# Patient Record
Sex: Male | Born: 1965 | Race: White | Hispanic: No | Marital: Single | Smoking: Former smoker
Health system: Southern US, Community
[De-identification: ages and names within clinical notes are randomized; demographics above are authoritative.]

## PROBLEM LIST (undated history)

## (undated) DIAGNOSIS — K4090 Unilateral inguinal hernia, without obstruction or gangrene, not specified as recurrent: Secondary | ICD-10-CM

## (undated) DIAGNOSIS — F411 Generalized anxiety disorder: Secondary | ICD-10-CM

## (undated) HISTORY — DX: Generalized anxiety disorder: F41.1

---

## 1999-09-11 ENCOUNTER — Encounter: Payer: Self-pay | Admitting: Emergency Medicine

## 1999-09-11 ENCOUNTER — Emergency Department (HOSPITAL_COMMUNITY): Admission: EM | Admit: 1999-09-11 | Discharge: 1999-09-11 | Payer: Self-pay | Admitting: Emergency Medicine

## 2001-01-23 ENCOUNTER — Emergency Department (HOSPITAL_COMMUNITY): Admission: EM | Admit: 2001-01-23 | Discharge: 2001-01-23 | Payer: Self-pay | Admitting: Emergency Medicine

## 2001-01-23 ENCOUNTER — Encounter: Payer: Self-pay | Admitting: Emergency Medicine

## 2002-01-31 ENCOUNTER — Encounter: Payer: Self-pay | Admitting: *Deleted

## 2002-01-31 ENCOUNTER — Emergency Department (HOSPITAL_COMMUNITY): Admission: EM | Admit: 2002-01-31 | Discharge: 2002-01-31 | Payer: Self-pay | Admitting: *Deleted

## 2012-06-10 ENCOUNTER — Encounter (HOSPITAL_COMMUNITY): Payer: Self-pay | Admitting: Emergency Medicine

## 2012-06-10 ENCOUNTER — Emergency Department (HOSPITAL_COMMUNITY)
Admission: EM | Admit: 2012-06-10 | Discharge: 2012-06-10 | Disposition: A | Discharge status: Home / Self Care | Payer: Self-pay | Attending: Emergency Medicine | Admitting: Emergency Medicine

## 2012-06-10 ENCOUNTER — Emergency Department (HOSPITAL_COMMUNITY): Payer: Self-pay

## 2012-06-10 DIAGNOSIS — N498 Inflammatory disorders of other specified male genital organs: Secondary | ICD-10-CM | POA: Insufficient documentation

## 2012-06-10 DIAGNOSIS — K4090 Unilateral inguinal hernia, without obstruction or gangrene, not specified as recurrent: Secondary | ICD-10-CM | POA: Insufficient documentation

## 2012-06-10 DIAGNOSIS — K5900 Constipation, unspecified: Secondary | ICD-10-CM | POA: Insufficient documentation

## 2012-06-10 DIAGNOSIS — R109 Unspecified abdominal pain: Secondary | ICD-10-CM | POA: Insufficient documentation

## 2012-06-10 HISTORY — DX: Unilateral inguinal hernia, without obstruction or gangrene, not specified as recurrent: K40.90

## 2012-06-10 LAB — CBC WITH DIFFERENTIAL/PLATELET
Basophils Absolute: 0 10*3/uL (ref 0.0–0.1)
Basophils Relative: 0 % (ref 0–1)
Eosinophils Absolute: 0.2 10*3/uL (ref 0.0–0.7)
Eosinophils Relative: 3 % (ref 0–5)
HCT: 44.4 % (ref 39.0–52.0)
Hemoglobin: 16.5 g/dL (ref 13.0–17.0)
Lymphocytes Relative: 20 % (ref 12–46)
Lymphs Abs: 1.2 10*3/uL (ref 0.7–4.0)
MCH: 31.2 pg (ref 26.0–34.0)
MCHC: 37.2 g/dL — ABNORMAL HIGH (ref 30.0–36.0)
MCV: 83.9 fL (ref 78.0–100.0)
Monocytes Absolute: 0.6 10*3/uL (ref 0.1–1.0)
Monocytes Relative: 10 % (ref 3–12)
Neutro Abs: 3.9 10*3/uL (ref 1.7–7.7)
Neutrophils Relative %: 67 % (ref 43–77)
Platelets: 128 10*3/uL — ABNORMAL LOW (ref 150–400)
RBC: 5.29 MIL/uL (ref 4.22–5.81)
RDW: 12.6 % (ref 11.5–15.5)
WBC: 5.8 10*3/uL (ref 4.0–10.5)

## 2012-06-10 LAB — COMPREHENSIVE METABOLIC PANEL
ALT: 28 U/L (ref 0–53)
AST: 23 U/L (ref 0–37)
Albumin: 4.4 g/dL (ref 3.5–5.2)
Alkaline Phosphatase: 52 U/L (ref 39–117)
BUN: 10 mg/dL (ref 6–23)
CO2: 30 mEq/L (ref 19–32)
Calcium: 10.2 mg/dL (ref 8.4–10.5)
Chloride: 98 mEq/L (ref 96–112)
Creatinine, Ser: 0.93 mg/dL (ref 0.50–1.35)
GFR calc Af Amer: >90 mL/min (ref >90)
GFR calc non Af Amer: >90 mL/min (ref >90)
Glucose, Bld: 100 mg/dL — ABNORMAL HIGH (ref 70–99)
Potassium: 4 mEq/L (ref 3.5–5.1)
Sodium: 137 mEq/L (ref 135–145)
Total Bilirubin: 1.1 mg/dL (ref 0.3–1.2)
Total Protein: 7.9 g/dL (ref 6.0–8.3)

## 2012-06-10 NOTE — ED Provider Notes (Signed)
Medical screening examination/treatment/procedure(s) were conducted as a shared visit with non-physician practitioner(s) and myself.  I personally evaluated the patient during the encounter.  Reducible left inguinal hernia. No evidence of incarceration. Discussed with general surgery. Will get followup as outpatient.  Donnetta Hutching, MD 06/10/12 1346

## 2012-06-10 NOTE — ED Notes (Signed)
Pt states has had hernia about one year. Pt states hurting really bad today. States while in prison was going to have surgery but was released and told to have it fixed after he got out. Nausea but denies vomiting

## 2012-06-10 NOTE — ED Notes (Signed)
Patient with no complaints at this time. Respirations even and unlabored. Skin warm/dry. Discharge instructions reviewed with patient at this time. Patient given opportunity to voice concerns/ask questions. IV removed per policy and band-aid applied to site. Patient discharged at this time and left Emergency Department with steady gait.  

## 2012-06-10 NOTE — ED Provider Notes (Signed)
History     CSN: [id number]  Arrival date & time 06/10/12  9147   First MD Initiated Contact with Patient 06/10/12 [phone]      Chief Complaint  Patient presents with  . Inguinal Hernia    (Consider location/radiation/quality/duration/timing/severity/associated sxs/prior treatment) HPI Comments: Pt recently released from prison.  Dx with L inguinal hernia ~ 9 months ago.  States he is trying to go the free clinic to get it repaired.  He also spoke with betty ratliffe about getting assistance through [employer] but has not filled out the paper work.  No fever or chills.  Recently having pain when urinating or having BM.  States the bulge goes away if he lies supine and presses on it, but it reoccurs whenever he stands up.  The history is provided by the patient. No language interpreter was used.    Past Medical History  Diagnosis Date  . Inguinal hernia     History reviewed. No pertinent past surgical history.  History reviewed. No pertinent family history.  History  Substance Use Topics  . Smoking status: Never Smoker   . Smokeless tobacco: Not on file  . Alcohol Use: No      Review of Systems  Constitutional: Negative for fever and chills.  Gastrointestinal: Positive for abdominal pain and constipation. Negative for nausea, vomiting, diarrhea and blood in stool.  Genitourinary: Positive for scrotal swelling. Negative for dysuria, urgency, frequency, hematuria, penile swelling, penile pain and testicular pain.  All other systems reviewed and are negative.    Allergies  Review of patient's allergies indicates no known allergies.  Home Medications   Current Outpatient Rx  Name  Route  Sig  Dispense  Refill  . ADULT MULTIVITAMIN W/MINERALS CH   Oral   Take 1 tablet by mouth daily.           BP 121/79  Pulse 75  Temp 97.8 F (36.6 C) (Oral)  Resp 20  SpO2 97%  Physical Exam  Nursing note and vitals reviewed. Constitutional: He is oriented to person,  place, and time. He appears well-developed and well-nourished.  HENT:  Head: Normocephalic and atraumatic.  Eyes: EOM are normal.  Neck: Normal range of motion.  Cardiovascular: Normal rate, regular rhythm, normal heart sounds and intact distal pulses.   Pulmonary/Chest: Effort normal and breath sounds normal. No respiratory distress.  Abdominal: Soft. He exhibits no distension and no mass. There is tenderness. There is no rebound and no guarding.  Genitourinary:    Circumcised.  Musculoskeletal: Normal range of motion.  Neurological: He is alert and oriented to person, place, and time.  Skin: Skin is warm and dry.  Psychiatric: He has a normal mood and affect. Judgment normal.    ED Course  Procedures (including critical care time)  Labs Reviewed  CBC WITH DIFFERENTIAL - Abnormal; Notable for the following:    MCHC 37.2 (*)     Platelets 128 (*)     All other components within normal limits  COMPREHENSIVE METABOLIC PANEL - Abnormal; Notable for the following:    Glucose, Bld 100 (*)     All other components within normal limits   Dg Abd Acute W/chest  06/10/2012  *RADIOLOGY REPORT*  Clinical Data: Known hernia, difficulty with bowel movements  ACUTE ABDOMEN SERIES (ABDOMEN 2 VIEW & CHEST 1 VIEW)  Comparison: None.  Findings: The heart and pulmonary vascularity are within normal limits.  The lungs are clear bilaterally.  The abdomen demonstrates a nonobstructive bowel gas  pattern.  No abnormal mass or abnormal calcifications are seen.  No free air is noted.  No acute bony abnormality is seen.  IMPRESSION: Nonspecific chest and abdomen.   Original Report Authenticated By: Alcide Clever, M.D.      1. Left inguinal hernia       MDM  Spoke with dr. Leticia Penna.  He will see pt in his office.        Evalina Field, [place] 06/10/12 1141

## 2013-02-14 ENCOUNTER — Emergency Department (HOSPITAL_COMMUNITY): Payer: Self-pay | Admitting: Anesthesiology

## 2013-02-14 ENCOUNTER — Encounter (HOSPITAL_COMMUNITY)
Admission: EM | Disposition: A | Discharge status: Home / Self Care | Payer: Self-pay | Source: Home / Self Care | Attending: Emergency Medicine

## 2013-02-14 ENCOUNTER — Encounter (HOSPITAL_COMMUNITY): Payer: Self-pay | Admitting: Anesthesiology

## 2013-02-14 ENCOUNTER — Emergency Department (HOSPITAL_COMMUNITY)
Admission: EM | Admit: 2013-02-14 | Discharge: 2013-02-14 | Disposition: A | Discharge status: Home / Self Care | Payer: Self-pay | Attending: General Surgery | Admitting: General Surgery

## 2013-02-14 ENCOUNTER — Encounter (HOSPITAL_COMMUNITY): Payer: Self-pay | Admitting: *Deleted

## 2013-02-14 DIAGNOSIS — K4030 Unilateral inguinal hernia, with obstruction, without gangrene, not specified as recurrent: Secondary | ICD-10-CM | POA: Insufficient documentation

## 2013-02-14 HISTORY — PX: INSERTION OF MESH: SHX5868

## 2013-02-14 HISTORY — PX: OMENTECTOMY: SHX5985

## 2013-02-14 HISTORY — PX: INGUINAL HERNIA REPAIR: SHX194

## 2013-02-14 LAB — COMPREHENSIVE METABOLIC PANEL
AST: 18 U/L (ref 0–37)
Albumin: 4.1 g/dL (ref 3.5–5.2)
Alkaline Phosphatase: 52 U/L (ref 39–117)
BUN: 9 mg/dL (ref 6–23)
CO2: 29 mEq/L (ref 19–32)
Chloride: 98 mEq/L (ref 96–112)
Potassium: 3.4 mEq/L — ABNORMAL LOW (ref 3.5–5.1)
Total Bilirubin: 1.5 mg/dL — ABNORMAL HIGH (ref 0.3–1.2)

## 2013-02-14 LAB — CBC WITH DIFFERENTIAL/PLATELET
Basophils Relative: 0 % (ref 0–1)
Hemoglobin: 16.1 g/dL (ref 13.0–17.0)
Lymphocytes Relative: 14 % (ref 12–46)
MCHC: 37.2 g/dL — ABNORMAL HIGH (ref 30.0–36.0)
Monocytes Relative: 10 % (ref 3–12)
Neutro Abs: 6 10*3/uL (ref 1.7–7.7)
Neutrophils Relative %: 75 % (ref 43–77)
RBC: 5.04 MIL/uL (ref 4.22–5.81)
WBC: 8 10*3/uL (ref 4.0–10.5)

## 2013-02-14 SURGERY — REPAIR, HERNIA, INGUINAL, INCARCERATED
Anesthesia: General | Wound class: Clean

## 2013-02-14 MED ORDER — HYDROCODONE-ACETAMINOPHEN 5-325 MG PO TABS: 1.0000 | ORAL_TABLET | ORAL | Status: DC | PRN

## 2013-02-14 MED ORDER — ROCURONIUM BROMIDE 100 MG/10ML IV SOLN
INTRAVENOUS | Status: DC | PRN
  Administered 2013-02-14: 10 mg via INTRAVENOUS
  Administered 2013-02-14: 25 mg via INTRAVENOUS
  Administered 2013-02-14: 5 mg via INTRAVENOUS
  Administered 2013-02-14: 10 mg via INTRAVENOUS

## 2013-02-14 MED ORDER — NEOSTIGMINE METHYLSULFATE 1 MG/ML IJ SOLN
INTRAMUSCULAR | Status: DC | PRN
  Administered 2013-02-14: 2 mg via INTRAVENOUS

## 2013-02-14 MED ORDER — FENTANYL CITRATE 0.05 MG/ML IJ SOLN
50.0000 ug | INTRAMUSCULAR | Status: AC | PRN
  Administered 2013-02-14 (×2): 50 ug via INTRAVENOUS
  Filled 2013-02-14 (×2): qty 2

## 2013-02-14 MED ORDER — ENOXAPARIN SODIUM 40 MG/0.4ML ~~LOC~~ SOLN
40.0000 mg | Freq: Once | SUBCUTANEOUS | Status: AC
  Administered 2013-02-14: 40 mg via SUBCUTANEOUS
  Filled 2013-02-14: qty 0.4

## 2013-02-14 MED ORDER — DEXTROSE 5 % IV SOLN
2.0000 g | INTRAVENOUS | Status: AC
  Administered 2013-02-14: 2 g via INTRAVENOUS

## 2013-02-14 MED ORDER — MIDAZOLAM HCL 2 MG/2ML IJ SOLN
1.0000 mg | INTRAMUSCULAR | Status: DC | PRN
  Administered 2013-02-14: 2 mg via INTRAVENOUS

## 2013-02-14 MED ORDER — BUPIVACAINE HCL (PF) 0.5 % IJ SOLN
INTRAMUSCULAR | Status: DC | PRN
  Administered 2013-02-14: 10 mL

## 2013-02-14 MED ORDER — DEXTROSE 5 % IV SOLN: 2.0000 g | INTRAVENOUS | Status: DC

## 2013-02-14 MED ORDER — LIDOCAINE HCL (CARDIAC) 10 MG/ML IV SOLN
INTRAVENOUS | Status: DC | PRN
  Administered 2013-02-14: 20 mg via INTRAVENOUS

## 2013-02-14 MED ORDER — GLYCOPYRROLATE 0.2 MG/ML IJ SOLN
INTRAMUSCULAR | Status: DC | PRN
  Administered 2013-02-14: 0.4 mg via INTRAVENOUS

## 2013-02-14 MED ORDER — FENTANYL CITRATE 0.05 MG/ML IJ SOLN
25.0000 ug | INTRAMUSCULAR | Status: DC | PRN
  Administered 2013-02-14 (×4): 50 ug via INTRAVENOUS

## 2013-02-14 MED ORDER — MORPHINE SULFATE 4 MG/ML IJ SOLN
4.0000 mg | INTRAMUSCULAR | Status: DC | PRN
  Administered 2013-02-14: 4 mg via INTRAVENOUS
  Filled 2013-02-14: qty 1

## 2013-02-14 MED ORDER — CELECOXIB 100 MG PO CAPS
400.0000 mg | ORAL_CAPSULE | Freq: Every day | ORAL | Status: AC
  Administered 2013-02-14: 400 mg via ORAL

## 2013-02-14 MED ORDER — LACTATED RINGERS IV SOLN
INTRAVENOUS | Status: DC
  Administered 2013-02-14 (×2): via INTRAVENOUS

## 2013-02-14 MED ORDER — SODIUM CHLORIDE 0.9 % IV SOLN
INTRAVENOUS | Status: DC
  Administered 2013-02-14: 14:00:00 via INTRAVENOUS

## 2013-02-14 MED ORDER — FENTANYL CITRATE 0.05 MG/ML IJ SOLN
INTRAMUSCULAR | Status: DC | PRN
  Administered 2013-02-14: 50 ug via INTRAVENOUS
  Administered 2013-02-14: 100 ug via INTRAVENOUS
  Administered 2013-02-14 (×2): 50 ug via INTRAVENOUS

## 2013-02-14 MED ORDER — ONDANSETRON HCL 4 MG/2ML IJ SOLN
4.0000 mg | Freq: Once | INTRAMUSCULAR | Status: AC | PRN
  Administered 2013-02-14: 4 mg via INTRAVENOUS

## 2013-02-14 MED ORDER — ONDANSETRON HCL 4 MG/2ML IJ SOLN
4.0000 mg | INTRAMUSCULAR | Status: AC | PRN
  Administered 2013-02-14 (×2): 4 mg via INTRAVENOUS
  Filled 2013-02-14 (×2): qty 2

## 2013-02-14 MED ORDER — 0.9 % SODIUM CHLORIDE (POUR BTL) OPTIME
TOPICAL | Status: DC | PRN
  Administered 2013-02-14: 1000 mL

## 2013-02-14 MED ORDER — PROPOFOL 10 MG/ML IV BOLUS
INTRAVENOUS | Status: DC | PRN
  Administered 2013-02-14: 30 mg via INTRAVENOUS
  Administered 2013-02-14: 20 mg via INTRAVENOUS
  Administered 2013-02-14: 150 mg via INTRAVENOUS

## 2013-02-14 MED ORDER — SUCCINYLCHOLINE CHLORIDE 20 MG/ML IJ SOLN
INTRAMUSCULAR | Status: DC | PRN
  Administered 2013-02-14: 140 mg via INTRAVENOUS

## 2013-02-14 SURGICAL SUPPLY — 40 items
BAG HAMPER (MISCELLANEOUS) ×3 IMPLANT
BENZOIN TINCTURE PRP APPL 2/3 (GAUZE/BANDAGES/DRESSINGS) ×3 IMPLANT
CLOTH BEACON ORANGE TIMEOUT ST (SAFETY) ×3 IMPLANT
COVER LIGHT HANDLE STERIS (MISCELLANEOUS) ×3 IMPLANT
DECANTER SPIKE VIAL GLASS SM (MISCELLANEOUS) ×3 IMPLANT
DRAIN PENROSE 18X.75 LTX STRL (MISCELLANEOUS) IMPLANT
ELECT REM PT RETURN 9FT ADLT (ELECTROSURGICAL) ×3
ELECTRODE REM PT RTRN 9FT ADLT (ELECTROSURGICAL) ×2 IMPLANT
FORMALIN 10 PREFIL 120ML (MISCELLANEOUS) IMPLANT
GLOVE BIO SURGEON STRL SZ 6.5 (GLOVE) ×3 IMPLANT
GLOVE BIOGEL PI IND STRL 7.0 (GLOVE) ×4 IMPLANT
GLOVE BIOGEL PI IND STRL 7.5 (GLOVE) ×4 IMPLANT
GLOVE BIOGEL PI INDICATOR 7.0 (GLOVE) ×2
GLOVE BIOGEL PI INDICATOR 7.5 (GLOVE) ×2
GLOVE ECLIPSE 7.0 STRL STRAW (GLOVE) ×3 IMPLANT
GLOVE OPTIFIT SS 6.5 STRL BRWN (GLOVE) ×3 IMPLANT
GOWN STRL REIN XL XLG (GOWN DISPOSABLE) ×9 IMPLANT
INST SET MAJOR GENERAL (KITS) ×3 IMPLANT
KIT ROOM TURNOVER APOR (KITS) ×3 IMPLANT
MANIFOLD NEPTUNE II (INSTRUMENTS) ×3 IMPLANT
MESH MARLEX PLUG MEDIUM (Mesh General) ×3 IMPLANT
NEEDLE HYPO 25X1 1.5 SAFETY (NEEDLE) ×3 IMPLANT
NS IRRIG 1000ML POUR BTL (IV SOLUTION) ×3 IMPLANT
PACK ABDOMINAL MAJOR (CUSTOM PROCEDURE TRAY) ×3 IMPLANT
PAD ARMBOARD 7.5X6 YLW CONV (MISCELLANEOUS) ×3 IMPLANT
SET BASIN LINEN APH (SET/KITS/TRAYS/PACK) ×3 IMPLANT
SOL PREP PROV IODINE SCRUB 4OZ (MISCELLANEOUS) ×3 IMPLANT
STRIP CLOSURE SKIN 1/2X4 (GAUZE/BANDAGES/DRESSINGS) ×3 IMPLANT
SUT ETHIBOND NAB MO 7 #0 18IN (SUTURE) ×3 IMPLANT
SUT MNCRL AB 4-0 PS2 18 (SUTURE) IMPLANT
SUT SILK 2 0 (SUTURE) ×1
SUT SILK 2-0 18XBRD TIE 12 (SUTURE) ×2 IMPLANT
SUT VIC AB 2-0 CT1 27 (SUTURE) ×1
SUT VIC AB 2-0 CT1 TAPERPNT 27 (SUTURE) ×2 IMPLANT
SUT VIC AB 3-0 SH 27 (SUTURE) ×2
SUT VIC AB 3-0 SH 27X BRD (SUTURE) ×4 IMPLANT
SUT VICRYL AB 3 0 TIES (SUTURE) IMPLANT
SYR BULB IRRIGATION 50ML (SYRINGE) ×3 IMPLANT
SYR CONTROL 10ML LL (SYRINGE) ×6 IMPLANT
TRAY FOLEY CATH 16FR SILVER (SET/KITS/TRAYS/PACK) ×3 IMPLANT

## 2013-02-14 NOTE — ED Provider Notes (Signed)
History    CSN: [id number] Arrival date & time 02/14/13  1244  First MD Initiated Contact with Patient 02/14/13 1337     Chief Complaint  Patient presents with  . Pelvic Pain    HPI Pt was seen at 1340.   Per pt, c/o gradual onset and persistence of constant left lower abd/pelvic "pain" since last evening.  Describes the pain as "my hernia hurts." States he has a known left inguinal hernia for the past 9 months that he can usually reduce by himself. States last night it "came out and I can't get in back in." States he went to sleep last night and woke up this morning with continued pain and inability to reduce the hernia. Last PO intake was last night. Denies vomiting/diarrhea, no fevers, no dysuria/hematuria, no back pain, no testicular pain, no rash, no CP/SOB, no black or blood in stools.      General Surgeon Dr. Leticia Penna Past Medical History  Diagnosis Date  . Inguinal hernia    History reviewed. No pertinent past surgical history.  History  Substance Use Topics  . Smoking status: Never Smoker   . Smokeless tobacco: Not on file  . Alcohol Use: No    Review of Systems ROS: Statement: All systems negative except as marked or noted in the HPI; Constitutional: Negative for fever and chills. ; ; Eyes: Negative for eye pain, redness and discharge. ; ; ENMT: Negative for ear pain, hoarseness, nasal congestion, sinus pressure and sore throat. ; ; Cardiovascular: Negative for chest pain, palpitations, diaphoresis, dyspnea and peripheral edema. ; ; Respiratory: Negative for cough, wheezing and stridor. ; ; Gastrointestinal: +left inguinal hernia. Negative for nausea, vomiting, diarrhea, blood in stool, hematemesis, jaundice and rectal bleeding. . ; ; Genitourinary: Negative for dysuria, flank pain and hematuria. ; ; Genital:  No penile drainage or rash, no testicular pain, +left scrotal swelling..;; Musculoskeletal: Negative for back pain and neck pain. Negative for swelling and trauma.; ;  Skin: Negative for pruritus, rash, abrasions, blisters, bruising and skin lesion.; ; Neuro: Negative for headache, lightheadedness and neck stiffness. Negative for weakness, altered level of consciousness , altered mental status, extremity weakness, paresthesias, involuntary movement, seizure and syncope.       Allergies  Review of patient's allergies indicates no known allergies.  Home Medications  No current outpatient prescriptions on file. BP 105/54  Pulse 56  Temp(Src) 97.6 F (36.4 C) (Oral)  Resp 18  Ht 5\' 10"  (1.778 m)  Wt 195 lb (88.451 kg)  BMI 27.98 kg/m2  SpO2 100% Physical Exam 1345: Physical examination:  Nursing notes reviewed; Vital signs and O2 SAT reviewed;  Constitutional: Well developed, Well nourished, Well hydrated, Uncomfortable appearing; Head:  Normocephalic, atraumatic; Eyes: EOMI, PERRL, No scleral icterus; ENMT: Mouth and pharynx normal, Mucous membranes moist; Neck: Supple, Full range of motion, No lymphadenopathy; Cardiovascular: Regular rate and rhythm, No murmur, rub, or gallop; Respiratory: Breath sounds clear & equal bilaterally, No rales, rhonchi, wheezes.  Speaking full sentences with ease, Normal respiratory effort/excursion; Chest: Nontender, Movement normal; Abdomen: Soft, +left scrotal area edematous, +palp tender hernia left scrotal area with overlying erythema, no ecchymosis. No testicular pain. No perineal erythema. Abd with mild diffuse TTP.  Nondistended, Normal bowel sounds; Genitourinary: No CVA tenderness; Extremities: Pulses normal, No tenderness, No edema, No calf edema or asymmetry.; Neuro: AA&Ox3, Major CN grossly intact.  Speech clear. No gross focal motor or sensory deficits in extremities.; Skin: Color normal, Warm, Dry.   ED Course  Procedures   MDM  MDM Reviewed: previous chart, nursing note and vitals Interpretation: labs   Results for orders placed during the hospital encounter of 02/14/13  CBC WITH DIFFERENTIAL      Result  Value Range   WBC 8.0  4.0 - 10.5 K/uL   RBC 5.04  4.22 - 5.81 MIL/uL   Hemoglobin 16.1  13.0 - 17.0 g/dL   HCT 16.1  09.6 - 04.5 %   MCV 85.9  78.0 - 100.0 fL   MCH 31.9  26.0 - 34.0 pg   MCHC 37.2 (*) 30.0 - 36.0 g/dL   RDW 40.9  81.1 - 91.4 %   Platelets 134 (*) 150 - 400 K/uL   Neutrophils Relative % 75  43 - 77 %   Neutro Abs 6.0  1.7 - 7.7 K/uL   Lymphocytes Relative 14  12 - 46 %   Lymphs Abs 1.1  0.7 - 4.0 K/uL   Monocytes Relative 10  3 - 12 %   Monocytes Absolute 0.8  0.1 - 1.0 K/uL   Eosinophils Relative 1  0 - 5 %   Eosinophils Absolute 0.1  0.0 - 0.7 K/uL   Basophils Relative 0  0 - 1 %   Basophils Absolute 0.0  0.0 - 0.1 K/uL  COMPREHENSIVE METABOLIC PANEL      Result Value Range   Sodium 136  135 - 145 mEq/L   Potassium 3.4 (*) 3.5 - 5.1 mEq/L   Chloride 98  96 - 112 mEq/L   CO2 29  19 - 32 mEq/L   Glucose, Bld 98  70 - 99 mg/dL   BUN 9  6 - 23 mg/dL   Creatinine, Ser 7.82  0.50 - 1.35 mg/dL   Calcium 95.6  8.4 - 21.3 mg/dL   Total Protein 7.8  6.0 - 8.3 g/dL   Albumin 4.1  3.5 - 5.2 g/dL   AST 18  0 - 37 U/L   ALT 17  0 - 53 U/L   Alkaline Phosphatase 52  39 - 117 U/L   Total Bilirubin 1.5 (*) 0.3 - 1.2 mg/dL   GFR calc non Af Amer >90  >90 mL/min   GFR calc Af Amer >90  >90 mL/min  LIPASE, BLOOD      Result Value Range   Lipase 23  11 - 59 U/L    1400:  Hernia is unable to be reduced and appears to be incarcerated at this time.  Dx and testing d/w pt and family.  Questions answered.  Verb understanding, agreeable to admit.  T/C to General Surgery Dr. Caesar Bookman, case discussed, including:  HPI, pertinent PM/SHx, VS/PE, dx testing, ED course and treatment:  Agreeable to admit, requests he will come to the ED for eval to go to the OR.    Laray Anger, DO 02/17/13 1606

## 2013-02-14 NOTE — ED Notes (Addendum)
Ongoing inguinal hernia x 9 months, has progressed since.  Has seen urologist and is suppose to get surgery once payment plan set up. Lower abdominal Pain became severe yesterday.

## 2013-02-14 NOTE — H&P (Signed)
Todd Reilly is an 47 y.o. male.   Chief Complaint: Left groin pain HPI: Patient presented to [facility] emergency department with increasing left groin pain. He states he is had increasing issues since he saw me in the office a year ago with his hernia protruding. He states on most occasions this was easily reducible. Over the last several weeks he's had more difficulty reducing but has been able to reduce it without any issues up until last night. He noted significant bulging. He had associated pain. He was unable to reduce the bulge. He attempted to rest taking that if he slept through the night it may spontaneously reduced. He awoke with increased pain in the left groin. He has had associated nausea. His appetite is suppressed currently. He has not had any emesis. He has had associated fevers and chills  Past Medical History  Diagnosis Date  . Inguinal hernia     History reviewed. No pertinent past surgical history.  History reviewed. No pertinent family history. Social History:  reports that he has never smoked. He does not have any smokeless tobacco history on file. He reports that he does not drink alcohol or use illicit drugs.  Allergies: No Known Allergies   (Not in a hospital admission)  Results for orders placed during the hospital encounter of 02/14/13 (from the past 48 hour(s))  CBC WITH DIFFERENTIAL     Status: Abnormal   Collection Time    02/14/13  1:46 PM      Result Value Range   WBC 8.0  4.0 - 10.5 K/uL   RBC 5.04  4.22 - 5.81 MIL/uL   Hemoglobin 16.1  13.0 - 17.0 g/dL   HCT 16.1  09.6 - 04.5 %   MCV 85.9  78.0 - 100.0 fL   MCH 31.9  26.0 - 34.0 pg   MCHC 37.2 (*) 30.0 - 36.0 g/dL   RDW 40.9  81.1 - 91.4 %   Platelets 134 (*) 150 - 400 K/uL   Neutrophils Relative % 75  43 - 77 %   Neutro Abs 6.0  1.7 - 7.7 K/uL   Lymphocytes Relative 14  12 - 46 %   Lymphs Abs 1.1  0.7 - 4.0 K/uL   Monocytes Relative 10  3 - 12 %   Monocytes Absolute 0.8  0.1 - 1.0 K/uL   Eosinophils Relative 1  0 - 5 %   Eosinophils Absolute 0.1  0.0 - 0.7 K/uL   Basophils Relative 0  0 - 1 %   Basophils Absolute 0.0  0.0 - 0.1 K/uL  COMPREHENSIVE METABOLIC PANEL     Status: Abnormal   Collection Time    02/14/13  1:46 PM      Result Value Range   Sodium 136  135 - 145 mEq/L   Potassium 3.4 (*) 3.5 - 5.1 mEq/L   Chloride 98  96 - 112 mEq/L   CO2 29  19 - 32 mEq/L   Glucose, Bld 98  70 - 99 mg/dL   BUN 9  6 - 23 mg/dL   Creatinine, Ser 7.82  0.50 - 1.35 mg/dL   Calcium 95.6  8.4 - 21.3 mg/dL   Total Protein 7.8  6.0 - 8.3 g/dL   Albumin 4.1  3.5 - 5.2 g/dL   AST 18  0 - 37 U/L   ALT 17  0 - 53 U/L   Alkaline Phosphatase 52  39 - 117 U/L   Total Bilirubin 1.5 (*)  0.3 - 1.2 mg/dL   GFR calc non Af Amer >90  >90 mL/min   GFR calc Af Amer >90  >90 mL/min   Comment:            The eGFR has been calculated     using the CKD EPI equation.     This calculation has not been     validated in all clinical     situations.     eGFR's persistently     <90 mL/min signify     possible Chronic Kidney Disease.  LIPASE, BLOOD     Status: None   Collection Time    02/14/13  1:46 PM      Result Value Range   Lipase 23  11 - 59 U/L   No results found.  Review of Systems  Constitutional: Positive for fever, chills and diaphoresis.  HENT: Negative.   Eyes: Negative.   Respiratory: Negative.   Cardiovascular: Negative.   Gastrointestinal: Positive for nausea and abdominal pain (left groin). Negative for blood in stool and melena.  Genitourinary: Negative.   Musculoskeletal: Negative.   Skin: Negative.   Neurological: Negative.   Endo/Heme/Allergies: Negative.   Psychiatric/Behavioral: Negative.     Blood pressure 121/70, pulse 87, temperature 98.1 F (36.7 C), temperature source Oral, resp. rate 20, height 5\' 10"  (1.778 m), weight 88.451 kg (195 lb), SpO2 100.00%. Physical Exam  Constitutional: He is oriented to person, place, and time. He appears well-developed and  well-nourished. He appears distressed.  HENT:  Head: Normocephalic and atraumatic.  Eyes: Conjunctivae and EOM are normal. Pupils are equal, round, and reactive to light. No scleral icterus.  Neck: Normal range of motion. Neck supple. No thyromegaly present.  Cardiovascular: Normal rate and regular rhythm.   Respiratory: Effort normal and breath sounds normal.  GI: He exhibits no distension and no mass. There is tenderness (left inguinal). There is no rebound and no guarding.  Incarcerated left in the hernia  Genitourinary: Penis normal.  Left scrotum filled with hernia contents, tight  Musculoskeletal: Normal range of motion.  Lymphadenopathy:    He has no cervical adenopathy.  Neurological: He is alert and oriented to person, place, and time.  Skin: Skin is warm and dry.     Assessment/Plan Left incarcerated inguinal hernia. Possible strangulation. Discuss surgical options with patient. We'll proceed emergently to the operating room. Risk of bleeding, infection, anastomotic leak should bowel resection be required, intraoperative cardiac and pulmonary events have all been discussed. Continue IV fluid hydration. Continue n.p.o. status. Continue DVT prophylaxis. We'll proceed to the operating room.  Maryanna Stuber C 02/14/2013, 2:57 PM

## 2013-02-14 NOTE — Op Note (Signed)
Patient:  Todd Reilly  DOB:  02/09/1966  MRN:  [id number]    Preop Diagnosis:  Incarcerated left inguinal hernia possible strangulation  Postop Diagnosis:  Incarcerated left inguinal hernia  Procedure:  Left inguinal hernia repair with mesh, partial omentectomy  Surgeon:  Dr. Tilford Pillar  Anes:  General endotracheal, 0.5% Sensorcaine plain for local  Indications:  Patient is a 47 year old male who presented to [facility] with over 12 hours of increasing left groin pain. He had a noted history of a inguinal hernia. This had previously been reducible however over the last 12 hours he states that the bulge had been present and he was not able to push it back in. He excruciating pain starting last night but thought he slept through the night it may spontaneously reduce. He has had associated nausea. Evaluation in the emergency department was suspicious for an incarcerated possibly strangulated inguinal hernia. Emergent surgical indications were discussed with patient. Risk including but not limited to risk of bleeding infection and possible bowel resection were discussed. Patient consented for the planned procedure  Procedure note:  Patient was taken to the operating room was placed into a supine position on the OR table which time the general anesthetic was administered. Once patient was asleep he was endotracheally intubated by the nurse anesthetist. At this point his abdomen thighs and scrotum were prepped with DuraPrep solution. Drapes are placed in standard fashion. Time out was performed. An incision was created in left groin. This was created with a 10 blade scalpel with additional dissection down to subcuticular tissues including Scarpa's fascia with electrocautery. Upon reaching the external oblique fascia this was scored and opened medially to the external inguinal ring with Metzenbaum scissors. At this point a passed a 10 years drain behind the cord structures and incarcerated  hernia sac. This is utilized to keep the cord structures mobilized. The cord structures were separated from the canal walls with blunt digital dissection. It is placed up to 2 entering into the hernia sac. Upon identifying the sac elevating it was sharply entered into with Metzenbaum scissors. The spine encountered omental fat. This was carefully delivered up into the field with continued counter pressure on the scrotum. Some serosanguineous fluid was also encountered. The incarcerated portion of omentum was dusky. There is no evidence of purulence and no odor. No bowel was incarcerated within the hernia. I then performed a partial omentectomy of the incarcerated portion of omentum. This was sent as a permanent specimen to pathology. The vessels were ligated using 2-0 silk. With excellent hemostasis and inspection of the remainder of the extracorporal omentum, I noted the omentum did appear viable and it was reduced back into the abdominal cavity. The sac was then closed and a pursestring suture was placed at the base of the sac. The redundant sac was returned back into the peritoneal cavity. A medium mesh plug was then brought to the field and was secured to the internal inguinal ring edges. This was pexed with a 2-0 Ethibond suture. The mesh, and was then brought to the field was pexed superiorly to the conjoined tendon medially to the tissue above the pubic tubercle, inferiorly to the shelving portion inguinal ligament, and laterally the keyhole defect was secured around the cord structures and tucked under the external oblique fascia. The cord structures were then returned back into the canal. The wound was irrigated. The external the fascia is reapproximated using a 2-0 Vicryl. Local anesthetic is instilled. A 3-0 Vicryl was  utilized reapproximate the Scarpa's fascia. The skin edges reapproximated using a 4-0 Monocryl in a running subcuticular suture. Skin was washed dried moist dry towel. Benzoin is applied  around incision. Half-inch Steri-Strips are placed.  The scrotum was palpated and both testicles were noted be within the appropriate orientation. The patient was allowed to come out of general anesthetic.  He was transferred to the PACU in stable condition. At the conclusion of procedure all instrument, sponge, needle counts are correct. Patient tolerated procedure extremely well.  Complications:  None apparent  EBL:  Minimal  Specimen:  Omentum

## 2013-02-14 NOTE — Transfer of Care (Signed)
Immediate Anesthesia Transfer of Care Note  Patient: Todd Reilly  Procedure(s) Performed: Procedure(s) (LRB): HERNIA REPAIR INGUINAL INCARCERATED (Left) INSERTION OF MESH (Left)  Patient Location: PACU  Anesthesia Type: General  Level of Consciousness: awake  Airway & Oxygen Therapy: Patient Spontanous Breathing and non-rebreather face mask  Post-op Assessment: Report given to PACU RN, Post -op Vital signs reviewed and stable and Patient moving all extremities  Post vital signs: Reviewed and stable  Complications: No apparent anesthesia complications

## 2013-02-14 NOTE — Anesthesia Procedure Notes (Signed)
Procedure Name: Intubation Date/Time: 02/14/2013 4:04 PM Performed by: Franco Nones Pre-anesthesia Checklist: Patient identified, Patient being monitored, Timeout performed, Emergency Drugs available and Suction available Patient Re-evaluated:Patient Re-evaluated prior to inductionOxygen Delivery Method: Circle System Utilized Preoxygenation: Pre-oxygenation with 100% oxygen Intubation Type: IV induction and Cricoid Pressure applied Laryngoscope Size: Miller and 2 Grade View: Grade I Tube type: Oral Tube size: 7.0 mm Number of attempts: 1 Airway Equipment and Method: stylet Placement Confirmation: ETT inserted through vocal cords under direct vision,  positive ETCO2 and breath sounds checked- equal and bilateral Secured at: 21 cm Tube secured with: Tape Dental Injury: Teeth and Oropharynx as per pre-operative assessment

## 2013-02-14 NOTE — ED Notes (Signed)
Pt signed consent prior to morphine administration; pt states he understands why he needs to sign the consent before narcotics given; pt's family at bedside; pt taken to preop and family sent to surgical waiting room

## 2013-02-14 NOTE — ED Notes (Signed)
MD at bedside. 

## 2013-02-14 NOTE — Anesthesia Postprocedure Evaluation (Signed)
  Anesthesia Post-op Note  Patient: Todd Reilly  Procedure(s) Performed: Procedure(s): HERNIA REPAIR INGUINAL INCARCERATED (Left) INSERTION OF MESH (Left) PARTIAL OMENTECTOMY (N/A)  Patient Location: PACU  Anesthesia Type:General  Level of Consciousness: awake, oriented and patient cooperative  Airway and Oxygen Therapy: Patient Spontanous Breathing and non-rebreather face mask  Post-op Pain: moderate  Post-op Assessment: Post-op Vital signs reviewed, Patient's Cardiovascular Status Stable, Respiratory Function Stable and Patent Airway  Post-op Vital Signs: Reviewed and stable  Complications: No apparent anesthesia complications

## 2013-02-14 NOTE — ED Notes (Signed)
Dr. Leticia Penna at bedside to examine.

## 2013-02-14 NOTE — Anesthesia Preprocedure Evaluation (Addendum)
Anesthesia Evaluation  Patient identified by MRN, date of birth, ID band Patient awake    Reviewed: Allergy & Precautions, H&P , NPO status , Patient's Chart, lab work & pertinent test results  Airway Mallampati: I TM Distance: >3 FB Neck ROM: Full    Dental  (+) Teeth Intact   Pulmonary neg pulmonary ROS,  breath sounds clear to auscultation        Cardiovascular negative cardio ROS  Rhythm:Regular Rate:Normal     Neuro/Psych    GI/Hepatic negative GI ROS,   Endo/Other    Renal/GU      Musculoskeletal   Abdominal   Peds  Hematology   Anesthesia Other Findings Incarcerated inguinal hernia  Reproductive/Obstetrics                          Anesthesia Physical Anesthesia Plan  ASA: I and emergent  Anesthesia Plan: General   Post-op Pain Management:    Induction: Intravenous, Rapid sequence and Cricoid pressure planned  Airway Management Planned: Oral ETT  Additional Equipment:   Intra-op Plan:   Post-operative Plan: Extubation in OR  Informed Consent: I have reviewed the patients History and Physical, chart, labs and discussed the procedure including the risks, benefits and alternatives for the proposed anesthesia with the patient or authorized representative who has indicated his/her understanding and acceptance.     Plan Discussed with:   Anesthesia Plan Comments:         Anesthesia Quick Evaluation

## 2013-02-18 ENCOUNTER — Encounter (HOSPITAL_COMMUNITY): Payer: Self-pay | Admitting: General Surgery

## 2015-07-19 ENCOUNTER — Ambulatory Visit (HOSPITAL_COMMUNITY)
Admission: RE | Admit: 2015-07-19 | Discharge: 2015-07-19 | Disposition: A | Discharge status: Home / Self Care | Payer: 59 | Source: Ambulatory Visit | Attending: Physician Assistant | Admitting: Physician Assistant

## 2015-07-19 ENCOUNTER — Other Ambulatory Visit (HOSPITAL_COMMUNITY): Payer: Self-pay | Admitting: Physician Assistant

## 2015-07-19 DIAGNOSIS — R05 Cough: Secondary | ICD-10-CM

## 2015-07-19 DIAGNOSIS — R059 Cough, unspecified: Secondary | ICD-10-CM

## 2015-09-21 ENCOUNTER — Institutional Professional Consult (permissible substitution): Payer: Self-pay | Admitting: Emergency Medicine

## 2017-12-06 ENCOUNTER — Emergency Department (HOSPITAL_COMMUNITY)
Admission: EM | Admit: 2017-12-06 | Discharge: 2017-12-06 | Disposition: A | Discharge status: Home / Self Care | Payer: Self-pay | Attending: Emergency Medicine | Admitting: Emergency Medicine

## 2017-12-06 ENCOUNTER — Encounter (HOSPITAL_COMMUNITY): Payer: Self-pay | Admitting: Emergency Medicine

## 2017-12-06 ENCOUNTER — Emergency Department (HOSPITAL_COMMUNITY): Payer: Self-pay

## 2017-12-06 ENCOUNTER — Other Ambulatory Visit: Payer: Self-pay

## 2017-12-06 DIAGNOSIS — Z79899 Other long term (current) drug therapy: Secondary | ICD-10-CM | POA: Insufficient documentation

## 2017-12-06 DIAGNOSIS — Z7982 Long term (current) use of aspirin: Secondary | ICD-10-CM | POA: Insufficient documentation

## 2017-12-06 DIAGNOSIS — K529 Noninfective gastroenteritis and colitis, unspecified: Secondary | ICD-10-CM | POA: Insufficient documentation

## 2017-12-06 DIAGNOSIS — E876 Hypokalemia: Secondary | ICD-10-CM | POA: Insufficient documentation

## 2017-12-06 LAB — CBC WITH DIFFERENTIAL/PLATELET
BASOS ABS: 0 10*3/uL (ref 0.0–0.1)
BASOS PCT: 0 %
EOS ABS: 0.1 10*3/uL (ref 0.0–0.7)
EOS PCT: 1 %
HCT: 41.7 % (ref 39.0–52.0)
HEMOGLOBIN: 14.6 g/dL (ref 13.0–17.0)
Lymphocytes Relative: 14 %
Lymphs Abs: 1.1 10*3/uL (ref 0.7–4.0)
MCH: 30.5 pg (ref 26.0–34.0)
MCHC: 35 g/dL (ref 30.0–36.0)
MCV: 87.2 fL (ref 78.0–100.0)
MONO ABS: 0.6 10*3/uL (ref 0.1–1.0)
Monocytes Relative: 8 %
NEUTROS ABS: 5.8 10*3/uL (ref 1.7–7.7)
Neutrophils Relative %: 77 %
PLATELETS: 102 10*3/uL — AB (ref 150–400)
RBC: 4.78 MIL/uL (ref 4.22–5.81)
RDW: 12.6 % (ref 11.5–15.5)
WBC: 7.5 10*3/uL (ref 4.0–10.5)

## 2017-12-06 LAB — COMPREHENSIVE METABOLIC PANEL
ALT: 17 U/L (ref 17–63)
AST: 20 U/L (ref 15–41)
Albumin: 3.9 g/dL (ref 3.5–5.0)
Alkaline Phosphatase: 42 U/L (ref 38–126)
Anion gap: 11 (ref 5–15)
BUN: 15 mg/dL (ref 6–20)
CHLORIDE: 101 mmol/L (ref 101–111)
CO2: 25 mmol/L (ref 22–32)
Calcium: 8.7 mg/dL — ABNORMAL LOW (ref 8.9–10.3)
Creatinine, Ser: 0.82 mg/dL (ref 0.61–1.24)
Glucose, Bld: 122 mg/dL — ABNORMAL HIGH (ref 65–99)
Potassium: 2.8 mmol/L — ABNORMAL LOW (ref 3.5–5.1)
Sodium: 137 mmol/L (ref 135–145)
Total Bilirubin: 1.8 mg/dL — ABNORMAL HIGH (ref 0.3–1.2)
Total Protein: 7.1 g/dL (ref 6.5–8.1)

## 2017-12-06 LAB — LIPASE, BLOOD: LIPASE: 24 U/L (ref 11–51)

## 2017-12-06 MED ORDER — POTASSIUM CHLORIDE 10 MEQ/100ML IV SOLN
10.0000 meq | Freq: Once | INTRAVENOUS | Status: AC
  Administered 2017-12-06: 10 meq via INTRAVENOUS
  Filled 2017-12-06: qty 100

## 2017-12-06 MED ORDER — DICYCLOMINE HCL 20 MG PO TABS: 20.0000 mg | ORAL_TABLET | Freq: Two times a day (BID) | ORAL | 0 refills | Status: DC | PRN

## 2017-12-06 MED ORDER — PROMETHAZINE HCL 25 MG PO TABS: 25.0000 mg | ORAL_TABLET | Freq: Four times a day (QID) | ORAL | 0 refills | Status: DC | PRN

## 2017-12-06 MED ORDER — POTASSIUM CHLORIDE CRYS ER 20 MEQ PO TBCR: 20.0000 meq | EXTENDED_RELEASE_TABLET | Freq: Two times a day (BID) | ORAL | 0 refills | Status: DC

## 2017-12-06 MED ORDER — MORPHINE SULFATE (PF) 4 MG/ML IV SOLN
4.0000 mg | Freq: Once | INTRAVENOUS | Status: AC
  Administered 2017-12-06: 4 mg via INTRAVENOUS
  Filled 2017-12-06: qty 1

## 2017-12-06 MED ORDER — IOPAMIDOL (ISOVUE-300) INJECTION 61%
100.0000 mL | Freq: Once | INTRAVENOUS | Status: AC | PRN
  Administered 2017-12-06: 100 mL via INTRAVENOUS

## 2017-12-06 MED ORDER — ONDANSETRON HCL 4 MG/2ML IJ SOLN
4.0000 mg | Freq: Once | INTRAMUSCULAR | Status: AC
  Administered 2017-12-06: 4 mg via INTRAVENOUS
  Filled 2017-12-06: qty 2

## 2017-12-06 MED ORDER — HYDROCODONE-ACETAMINOPHEN 5-325 MG PO TABS: 1.0000 | ORAL_TABLET | ORAL | 0 refills | Status: DC | PRN

## 2017-12-06 MED ORDER — POTASSIUM CHLORIDE CRYS ER 20 MEQ PO TBCR
20.0000 meq | EXTENDED_RELEASE_TABLET | Freq: Once | ORAL | Status: AC
  Administered 2017-12-06: 20 meq via ORAL
  Filled 2017-12-06: qty 1

## 2017-12-06 NOTE — ED Triage Notes (Addendum)
Pt c/o of abdominal pain and n/v/d since x2 months.  No fever.

## 2017-12-06 NOTE — Discharge Instructions (Signed)
Your lab tests and CT imaging is negative for an acute source of your symptoms (pancreas, liver, gallbladder, appendix and intestines are all good).  You may have a virus but would plan a follow up with your primary doctor if your symptoms persist.  Use the medicines prescribed for pain and nausea.  Your potassium is low today, so has been supplemented (commonly lost with vomiting and diarrhea).  You have been prescribed a small amount to complete this supplementation.

## 2017-12-06 NOTE — ED Provider Notes (Signed)
[facility] EMERGENCY DEPARTMENT Provider Note   CSN: [id number] Arrival date & time: 12/06/17  0945     History   Chief Complaint Chief Complaint  Patient presents with  . Abdominal Pain    HPI Todd Reilly is a 52 y.o. male with no significant medical hx, but with history of inguinal hernia repair in 2014 presenting with a 2 month episode of intermittent abdominal pain with nausea and bilious vomiting.  He describes squeezing cramping pain in his upper bilateral abdomen in association with water green diarrhea, generally will last a few hours, then resolves, occurring every few weeks.  This episode started yesterday and has been more severe than prior episodes.  His last emesis and bm occurred shortly after waking today at 7 am.  He tried to eat a sandwich as he had no oral intake since yesterday am, which worsened his sx.  He denies hematemesis or bloody stools. He also denies back pain, chest pain or sob.  He has tried taking antacids without relief of sx.  The history is provided by the patient.    Past Medical History:  Diagnosis Date  . Inguinal hernia     There are no active problems to display for this patient.   Past Surgical History:  Procedure Laterality Date  . INGUINAL HERNIA REPAIR Left 02/14/2013   Procedure: HERNIA REPAIR INGUINAL INCARCERATED;  Surgeon: Fabio Bering, MD;  Location: [facility];  Service: General;  Laterality: Left;  . INSERTION OF MESH Left 02/14/2013   Procedure: INSERTION OF MESH;  Surgeon: Fabio Bering, MD;  Location: [facility];  Service: General;  Laterality: Left;  . OMENTECTOMY N/A 02/14/2013   Procedure: PARTIAL OMENTECTOMY;  Surgeon: Fabio Bering, MD;  Location: [facility];  Service: General;  Laterality: N/A;        Home Medications    Prior to Admission medications   Medication Sig Start Date End Date Taking? Authorizing Provider  aspirin 325 MG tablet Take 325 mg by mouth daily.   Yes [provider]  ferrous sulfate 325  (65 FE) MG EC tablet Take 325 mg by mouth daily.   Yes [provider]  naproxen sodium (ALEVE) 220 MG tablet Take 440 mg by mouth daily.   Yes [provider]  Omega-3 Fatty Acids (FISH OIL) 1000 MG CAPS Take 1 capsule by mouth daily.   Yes [provider]  dicyclomine (BENTYL) 20 MG tablet Take 1 tablet (20 mg total) by mouth 2 (two) times daily as needed (abdominal cramping). 12/06/17   Burgess Amor, PA-C  potassium chloride SA (K-DUR,KLOR-CON) 20 MEQ tablet Take 1 tablet (20 mEq total) by mouth 2 (two) times daily. 12/06/17   Burgess Amor, PA-C  promethazine (PHENERGAN) 25 MG tablet Take 1 tablet (25 mg total) by mouth every 6 (six) hours as needed for nausea or vomiting. 12/06/17   Burgess Amor, PA-C    Family History History reviewed. No pertinent family history.  Social History Social History   Tobacco Use  . Smoking status: Never Smoker  . Smokeless tobacco: Never Used  Substance Use Topics  . Alcohol use: No  . Drug use: No     Allergies   Patient has no known allergies.   Review of Systems Review of Systems  Constitutional: Positive for chills. Negative for fever.  HENT: Negative for congestion and sore throat.   Eyes: Negative.   Respiratory: Negative for chest tightness and shortness of breath.   Cardiovascular: Negative for  chest pain.  Gastrointestinal: Positive for abdominal pain, diarrhea, nausea and vomiting. Negative for blood in stool.  Genitourinary: Negative.   Musculoskeletal: Negative for arthralgias, joint swelling and neck pain.  Skin: Negative.  Negative for rash and wound.  Neurological: Negative for dizziness, weakness, light-headedness, numbness and headaches.  Psychiatric/Behavioral: Negative.      Physical Exam Updated Vital Signs BP 124/84   Pulse 64   Temp 97.8 F (36.6 C) (Oral)   Resp 18   Ht  (1.778 m)   Wt 87.1 kg (192 lb)   SpO2 99%   BMI 27.55 kg/m   Physical Exam  Constitutional: He appears  well-developed and well-nourished.  HENT:  Head: Normocephalic and atraumatic.  Mouth/Throat: Oropharynx is clear and moist.  Eyes: Conjunctivae are normal.  Neck: Normal range of motion.  Cardiovascular: Normal rate, regular rhythm, normal heart sounds and intact distal pulses.  Pulmonary/Chest: Effort normal and breath sounds normal. No respiratory distress. He has no wheezes.  Abdominal: Soft. Bowel sounds are normal. There is no hepatosplenomegaly. There is tenderness in the right upper quadrant, epigastric area and left upper quadrant. There is guarding. There is no CVA tenderness, no tenderness at McBurney's point and negative Murphy's sign. No hernia.  Musculoskeletal: Normal range of motion.  Neurological: He is alert.  Skin: Skin is warm and dry.  Psychiatric: He has a normal mood and affect.  Nursing note and vitals reviewed.    ED Treatments / Results  Labs (all labs ordered are listed, but only abnormal results are displayed) Labs Reviewed  COMPREHENSIVE METABOLIC PANEL - Abnormal; Notable for the following components:      Result Value   Potassium 2.8 (*)    Glucose, Bld 122 (*)    Calcium 8.7 (*)    Total Bilirubin 1.8 (*)    All other components within normal limits  CBC WITH DIFFERENTIAL/PLATELET - Abnormal; Notable for the following components:   Platelets 102 (*)    All other components within normal limits  LIPASE, BLOOD  URINALYSIS, ROUTINE W REFLEX MICROSCOPIC    EKG None  Radiology Ct Abdomen Pelvis W Contrast  Result Date: 12/06/2017 CLINICAL DATA:  Right abdominal pain for 2 months with nausea EXAM: CT ABDOMEN AND PELVIS WITH CONTRAST TECHNIQUE: Multidetector CT imaging of the abdomen and pelvis was performed using the standard protocol following bolus administration of intravenous contrast. CONTRAST:  ISOVUE-300 IOPAMIDOL (ISOVUE-300) INJECTION 61% COMPARISON:  07/31/2017, 06/10/2012 FINDINGS: Lower chest: No acute abnormality. Hepatobiliary: No  focal liver abnormality is seen. No gallstones, gallbladder wall thickening, or biliary dilatation. Pancreas: Unremarkable. No pancreatic ductal dilatation or surrounding inflammatory changes. Spleen: Normal in size without focal abnormality. Adrenals/Urinary Tract: Adrenal glands are unremarkable. Kidneys are normal, without renal calculi, focal lesion, or hydronephrosis. Bladder is unremarkable. Stomach/Bowel: Negative for bowel obstruction, significant dilatation, ileus, free air. No fluid collection or abscess. Normal appendix demonstrated. Chronic diverticulosis of the sigmoid colon with muscular hypertrophy. No surrounding inflammatory process or fluid collection. No evidence of perforation Vascular/Lymphatic: Minor aortoiliac atherosclerosis without aneurysm. No occlusive process. Mesenteric and renal vasculature appear patent. No veno-occlusive process. Reproductive: Prostate normal size with calcifications. Seminal vesicles symmetric. Other: No abdominal wall hernia or abnormality. No abdominopelvic ascites. Musculoskeletal: No acute or significant osseous findings. IMPRESSION: No acute abdominal or pelvic finding by CT. Aortic atherosclerosis without aneurysm Normal appendix Chronic sigmoid diverticulosis with muscular hypertrophy but no significant acute inflammatory process. No fluid collection or abscess Electronically Signed   By: Judie Petit.  Shick M.D.   On: 12/06/2017 14:09    Procedures Procedures (including critical care time)  Medications Ordered in ED Medications  morphine 4 MG/ML injection 4 mg (4 mg Intravenous Given 12/06/17 1048)  ondansetron (ZOFRAN) injection 4 mg (4 mg Intravenous Given 12/06/17 1048)  potassium chloride 10 mEq in 100 mL IVPB (0 mEq Intravenous Stopped 12/06/17 1232)  potassium chloride SA (K-DUR,KLOR-CON) CR tablet 20 mEq (20 mEq Oral Given 12/06/17 1127)  iopamidol (ISOVUE-300) 61 % injection 100 mL (100 mLs Intravenous Contrast Given 12/06/17 1326)     Initial Impression  / Assessment and Plan / ED Course  I have reviewed the triage vital signs and the nursing notes.  Pertinent labs & imaging results that were available during my care of the patient were reviewed by me and considered in my medical decision making (see chart for details).     Pt was sx free after giving IV fluids, zofran, morphine. Labs, imaging reviewed and discussed with pt.  He has hypokalemia, probably due to GI losses. This was replaced with IV and PO , additionally given script for home use.  Intermittent abd pain with n/v/d - possibly inflammatory, also could be functional source such as IBS.  Pt may need further tests and/or GI eval.  Advised recheck by pcp early next week.  No emergent findings per todays work up.  Re-exam at recheck - abdomen soft, no guarding.   Final Clinical Impressions(s) / ED Diagnoses   Final diagnoses:  Gastroenteritis  Hypokalemia    ED Discharge Orders        Ordered    promethazine (PHENERGAN) 25 MG tablet  Every 6 hours PRN,   Status:  Discontinued     12/06/17 1417    HYDROcodone-acetaminophen (NORCO/VICODIN) 5-325 MG tablet  Every 4 hours PRN,   Status:  Discontinued     12/06/17 1417    dicyclomine (BENTYL) 20 MG tablet  2 times daily PRN,   Status:  Discontinued     12/06/17 1419    potassium chloride SA (K-DUR,KLOR-CON) 20 MEQ tablet  2 times daily     12/06/17 1422    dicyclomine (BENTYL) 20 MG tablet  2 times daily PRN     12/06/17 1424    promethazine (PHENERGAN) 25 MG tablet  Every 6 hours PRN     12/06/17 1424       Burgess Amor, PA-C 12/06/17 1731    Bethann Berkshire, MD 12/07/17 [phone]

## 2017-12-06 NOTE — ED Notes (Signed)
Gone to CT

## 2017-12-06 NOTE — ED Notes (Signed)
Pt states he does not need to urinate at this time, aware of DO  

## 2017-12-06 NOTE — ED Notes (Signed)
Intermittent abdominal pain x 2 months with vomiting, some constipation. Recent tick bites, Also pt take 2 aleve every morning as soon as he wakes.

## 2018-04-08 ENCOUNTER — Emergency Department (HOSPITAL_COMMUNITY): Payer: Self-pay

## 2018-04-08 ENCOUNTER — Emergency Department (HOSPITAL_COMMUNITY)
Admission: EM | Admit: 2018-04-08 | Discharge: 2018-04-08 | Disposition: A | Discharge status: Home / Self Care | Payer: Self-pay | Attending: Emergency Medicine | Admitting: Emergency Medicine

## 2018-04-08 ENCOUNTER — Encounter (HOSPITAL_COMMUNITY): Payer: Self-pay

## 2018-04-08 ENCOUNTER — Other Ambulatory Visit: Payer: Self-pay

## 2018-04-08 DIAGNOSIS — M25551 Pain in right hip: Secondary | ICD-10-CM | POA: Insufficient documentation

## 2018-04-08 DIAGNOSIS — R52 Pain, unspecified: Secondary | ICD-10-CM

## 2018-04-08 DIAGNOSIS — Z79899 Other long term (current) drug therapy: Secondary | ICD-10-CM | POA: Insufficient documentation

## 2018-04-08 DIAGNOSIS — Z7982 Long term (current) use of aspirin: Secondary | ICD-10-CM | POA: Insufficient documentation

## 2018-04-08 MED ORDER — MELOXICAM 7.5 MG PO TABS: 7.5000 mg | ORAL_TABLET | Freq: Every day | ORAL | 0 refills | Status: DC

## 2018-04-08 NOTE — Discharge Instructions (Signed)
Schedule to see Dr. Romeo Apple for evaluation.  You have a small area of bony defect that may be from old injury.  The Orthopaedist may want to evaluate this area further.

## 2018-04-08 NOTE — ED Triage Notes (Signed)
Pt reports chronic pain in lower back, r hip, r knee, left thumb.  Denies recent injury.
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
## 2018-10-07 ENCOUNTER — Other Ambulatory Visit: Payer: Self-pay

## 2018-10-07 ENCOUNTER — Emergency Department (HOSPITAL_COMMUNITY)
Admission: EM | Admit: 2018-10-07 | Discharge: 2018-10-07 | Disposition: A | Discharge status: Home / Self Care | Payer: Self-pay | Attending: Emergency Medicine | Admitting: Emergency Medicine

## 2018-10-07 ENCOUNTER — Encounter (HOSPITAL_COMMUNITY): Payer: Self-pay | Admitting: Emergency Medicine

## 2018-10-07 DIAGNOSIS — K0889 Other specified disorders of teeth and supporting structures: Secondary | ICD-10-CM

## 2018-10-07 DIAGNOSIS — K047 Periapical abscess without sinus: Secondary | ICD-10-CM | POA: Insufficient documentation

## 2018-10-07 DIAGNOSIS — Z7982 Long term (current) use of aspirin: Secondary | ICD-10-CM | POA: Insufficient documentation

## 2018-10-07 MED ORDER — AMOXICILLIN 250 MG PO CAPS
500.0000 mg | ORAL_CAPSULE | Freq: Once | ORAL | Status: AC
  Administered 2018-10-07: 500 mg via ORAL
  Filled 2018-10-07: qty 2

## 2018-10-07 MED ORDER — TRAMADOL HCL 50 MG PO TABS
50.0000 mg | ORAL_TABLET | Freq: Once | ORAL | Status: AC
  Administered 2018-10-07: 50 mg via ORAL
  Filled 2018-10-07: qty 1

## 2018-10-07 MED ORDER — IBUPROFEN 800 MG PO TABS: 800.0000 mg | ORAL_TABLET | Freq: Three times a day (TID) | ORAL | 0 refills | Status: DC

## 2018-10-07 MED ORDER — AMOXICILLIN 500 MG PO CAPS: 500.0000 mg | ORAL_CAPSULE | Freq: Three times a day (TID) | ORAL | 0 refills | Status: AC

## 2018-10-07 MED ORDER — TRAMADOL HCL 50 MG PO TABS: 50.0000 mg | ORAL_TABLET | Freq: Four times a day (QID) | ORAL | 0 refills | Status: DC | PRN

## 2018-10-07 MED ORDER — IBUPROFEN 800 MG PO TABS
800.0000 mg | ORAL_TABLET | Freq: Once | ORAL | Status: AC
  Administered 2018-10-07: 800 mg via ORAL
  Filled 2018-10-07: qty 1

## 2018-10-07 NOTE — ED Triage Notes (Signed)
Pain to area on lt lower jaw x 3 days, pain with palpation and eating to that area

## 2018-10-07 NOTE — Discharge Instructions (Addendum)
Complete your entire course of antibiotics as prescribed.  You  may use the tramadol for pain relief but do not drive within 4 hours of taking as this will make you drowsy.  Avoid applying heat or ice to this abscess area which can worsen your symptoms.  You may use warm salt water swish and spit treatment or half peroxide and water swish and spit after meals to keep this area clean as discussed.  Call the dentist listed above for further management of your symptoms. ° ° °

## 2018-10-07 NOTE — ED Notes (Signed)
ED Provider at bedside. 

## 2018-10-09 NOTE — ED Provider Notes (Signed)
[facility] EMERGENCY DEPARTMENT Provider Note   CSN: [id number] Arrival date & time: 10/07/18  1043    History   Chief Complaint Chief Complaint  Patient presents with  . Dental Pain    HPI Todd Reilly is a 53 y.o. male presenting with a 3 day history of dental pain and gingival swelling.   The patient denies prior problems with the tooth involved which just started being painful while eating.  There has been no fevers, chills, nausea or vomiting, also no complaint of difficulty swallowing, although chewing makes pain worse.  The patient has tried topical analgesics without relief of symptoms.         The history is provided by the patient.    Past Medical History:  Diagnosis Date  . Inguinal hernia     There are no active problems to display for this patient.   Past Surgical History:  Procedure Laterality Date  . INGUINAL HERNIA REPAIR Left 02/14/2013   Procedure: HERNIA REPAIR INGUINAL INCARCERATED;  Surgeon: Fabio Bering, MD;  Location: [facility];  Service: General;  Laterality: Left;  . INSERTION OF MESH Left 02/14/2013   Procedure: INSERTION OF MESH;  Surgeon: Fabio Bering, MD;  Location: [facility];  Service: General;  Laterality: Left;  . OMENTECTOMY N/A 02/14/2013   Procedure: PARTIAL OMENTECTOMY;  Surgeon: Fabio Bering, MD;  Location: [facility];  Service: General;  Laterality: N/A;        Home Medications    Prior to Admission medications   Medication Sig Start Date End Date Taking? Authorizing Provider  aspirin 325 MG tablet Take 325 mg by mouth daily.   Yes [provider]  Omega-3 Fatty Acids (FISH OIL) 1000 MG CAPS Take 1 capsule by mouth daily.   Yes [provider]  potassium chloride SA (K-DUR,KLOR-CON) 20 MEQ tablet Take 1 tablet (20 mEq total) by mouth 2 (two) times daily. 12/06/17  Yes Tyechia Allmendinger, Raynelle Fanning, PA-C  amoxicillin (AMOXIL) 500 MG capsule Take 1 capsule (500 mg total) by mouth 3 (three) times daily for 10 days. 10/07/18 10/17/18  Burgess Amor, PA-C  ibuprofen (ADVIL,MOTRIN) 800 MG tablet Take 1 tablet (800 mg total) by mouth 3 (three) times daily. 10/07/18   Burgess Amor, PA-C  traMADol (ULTRAM) 50 MG tablet Take 1 tablet (50 mg total) by mouth every 6 (six) hours as needed. 10/07/18   Burgess Amor, PA-C    Family History No family history on file.  Social History Social History   Tobacco Use  . Smoking status: Never Smoker  . Smokeless tobacco: Never Used  Substance Use Topics  . Alcohol use: No  . Drug use: No     Allergies   Patient has no known allergies.   Review of Systems Review of Systems  Constitutional: Negative for fever.  HENT: Positive for dental problem. Negative for facial swelling and sore throat.   Respiratory: Negative for shortness of breath.   Musculoskeletal: Negative for neck pain and neck stiffness.     Physical Exam Updated Vital Signs BP 139/81 (BP Location: Right Arm)   Pulse 77   Temp 97.9 F (36.6 C) (Oral)   Resp 19   Ht 5\' 10"  (1.778 m)   Wt 81.6 kg   SpO2 99%   BMI 25.83 kg/m   Physical Exam Constitutional:      General: He is not in acute distress.    Appearance: He is well-developed.  HENT:     Head: Normocephalic and  atraumatic.     Jaw: No trismus or pain on movement.     Comments: No facial erythema or edema. Sublingual space soft.    Right Ear: Tympanic membrane and external ear normal.     Left Ear: Tympanic membrane and external ear normal.     Mouth/Throat:     Mouth: No oral lesions.     Dentition: Dental tenderness, gingival swelling, dental caries and dental abscesses present.     Comments: Left lower second molar with occlusive surface decay. Mild lateral gingival erythema. Eyes:     Conjunctiva/sclera: Conjunctivae normal.  Neck:     Musculoskeletal: Normal range of motion and neck supple.  Cardiovascular:     Rate and Rhythm: Normal rate.     Heart sounds: Normal heart sounds.  Pulmonary:     Effort: Pulmonary effort is normal.   Musculoskeletal: Normal range of motion.  Lymphadenopathy:     Cervical: No cervical adenopathy.  Skin:    General: Skin is warm and dry.     Findings: No erythema.  Neurological:     Mental Status: He is alert and oriented to person, place, and time.      ED Treatments / Results  Labs (all labs ordered are listed, but only abnormal results are displayed) Labs Reviewed - No data to display  EKG None  Radiology No results found.  Procedures Procedures (including critical care time)  Medications Ordered in ED Medications  amoxicillin (AMOXIL) capsule 500 mg (500 mg Oral Given 10/07/18 1251)  ibuprofen (ADVIL,MOTRIN) tablet 800 mg (800 mg Oral Given 10/07/18 1251)  traMADol (ULTRAM) tablet 50 mg (50 mg Oral Given 10/07/18 1251)     Initial Impression / Assessment and Plan / ED Course  I have reviewed the triage vital signs and the nursing notes.  Pertinent labs & imaging results that were available during my care of the patient were reviewed by me and considered in my medical decision making (see chart for details).        Dental decay with suspected apical abscess. Amoxil, ibuprofen, few tramadol. Dental referrals given. No pus pocket identified, no Ludwigs or facial cellulitis.  Final Clinical Impressions(s) / ED Diagnoses   Final diagnoses:  Toothache  Dental abscess    ED Discharge Orders         Ordered    amoxicillin (AMOXIL) 500 MG capsule  3 times daily     10/07/18 1240    ibuprofen (ADVIL,MOTRIN) 800 MG tablet  3 times daily     10/07/18 1240    traMADol (ULTRAM) 50 MG tablet  Every 6 hours PRN     10/07/18 1240           Burgess Amor, PA-C 10/09/18 1016    Blane Ohara, MD 10/10/18 1214

## 2019-06-30 ENCOUNTER — Emergency Department (HOSPITAL_COMMUNITY)
Admission: EM | Admit: 2019-06-30 | Discharge: 2019-06-30 | Disposition: A | Discharge status: Home / Self Care | Payer: Self-pay | Attending: Emergency Medicine | Admitting: Emergency Medicine

## 2019-06-30 ENCOUNTER — Encounter (HOSPITAL_COMMUNITY): Payer: Self-pay | Admitting: Emergency Medicine

## 2019-06-30 ENCOUNTER — Other Ambulatory Visit: Payer: Self-pay

## 2019-06-30 DIAGNOSIS — Z7982 Long term (current) use of aspirin: Secondary | ICD-10-CM | POA: Insufficient documentation

## 2019-06-30 DIAGNOSIS — K047 Periapical abscess without sinus: Secondary | ICD-10-CM | POA: Insufficient documentation

## 2019-06-30 DIAGNOSIS — K0889 Other specified disorders of teeth and supporting structures: Secondary | ICD-10-CM

## 2019-06-30 MED ORDER — IBUPROFEN 800 MG PO TABS: 800.0000 mg | ORAL_TABLET | Freq: Three times a day (TID) | ORAL | 0 refills | Status: DC

## 2019-06-30 MED ORDER — PENICILLIN V POTASSIUM 500 MG PO TABS: 500.0000 mg | ORAL_TABLET | Freq: Four times a day (QID) | ORAL | 0 refills | Status: AC

## 2019-06-30 NOTE — Discharge Instructions (Signed)
Start Penicillin 4 times daily for one week Take Ibuprofen 800mg  for pain Please follow up with a dentist

## 2019-06-30 NOTE — ED Provider Notes (Signed)
[facility] EMERGENCY DEPARTMENT Provider Note   CSN: [id number] Arrival date & time: 06/30/19  0725     History   Chief Complaint Chief Complaint  Patient presents with  . Dental Pain    HPI Todd Reilly is a 53 y.o. male who presents with dental pain. He states that a while back he was eating a peanut butter sandwich and one of his posterior molars on the left lower side broke and the filling came out. About 2 days ago he started to have gradually worsening pain in that tooth. He's tried multiple OTC meds without significant relief. Last night he noticed a knot over the left lower jaw and this morning it was much better so he is requesting antibiotics so he can go get his tooth pulled. No fever. He's had some difficulty opening his jaw. No SOB or inability to swallow.     HPI  Past Medical History:  Diagnosis Date  . Inguinal hernia     There are no active problems to display for this patient.   Past Surgical History:  Procedure Laterality Date  . INGUINAL HERNIA REPAIR Left 02/14/2013   Procedure: HERNIA REPAIR INGUINAL INCARCERATED;  Surgeon: Donato Heinz, MD;  Location: [facility];  Service: General;  Laterality: Left;  . INSERTION OF MESH Left 02/14/2013   Procedure: INSERTION OF MESH;  Surgeon: Donato Heinz, MD;  Location: [facility];  Service: General;  Laterality: Left;  . OMENTECTOMY N/A 02/14/2013   Procedure: PARTIAL OMENTECTOMY;  Surgeon: Donato Heinz, MD;  Location: [facility];  Service: General;  Laterality: N/A;        Home Medications    Prior to Admission medications   Medication Sig Start Date End Date Taking? Authorizing Provider  aspirin 325 MG tablet Take 325 mg by mouth daily.    [provider]  ibuprofen (ADVIL,MOTRIN) 800 MG tablet Take 1 tablet (800 mg total) by mouth 3 (three) times daily. 10/07/18   Evalee Jefferson, PA-C  Omega-3 Fatty Acids (FISH OIL) 1000 MG CAPS Take 1 capsule by mouth daily.    [provider]  potassium  chloride SA (K-DUR,KLOR-CON) 20 MEQ tablet Take 1 tablet (20 mEq total) by mouth 2 (two) times daily. 12/06/17   Evalee Jefferson, PA-C  traMADol (ULTRAM) 50 MG tablet Take 1 tablet (50 mg total) by mouth every 6 (six) hours as needed. 10/07/18   Evalee Jefferson, PA-C    Family History History reviewed. No pertinent family history.  Social History Social History   Tobacco Use  . Smoking status: Never Smoker  . Smokeless tobacco: Never Used  Substance Use Topics  . Alcohol use: No  . Drug use: No     Allergies   Patient has no known allergies.   Review of Systems Review of Systems  Constitutional: Negative for fever.  HENT: Positive for dental problem and facial swelling.      Physical Exam Updated Vital Signs BP (!) 127/99 (BP Location: Left Arm)   Pulse 93   Temp 97.6 F (36.4 C) (Oral)   Resp 16   Ht 5\' 10"  (1.778 m)   Wt 80.7 kg   SpO2 97%   BMI 25.54 kg/m   Physical Exam Vitals signs and nursing note reviewed.  Constitutional:      General: He is not in acute distress.    Appearance: Normal appearance. He is well-developed. He is not ill-appearing.  HENT:     Head: Normocephalic and atraumatic.  Jaw: Trismus and swelling (left lower) present.     Mouth/Throat:     Lips: Pink.     Mouth: Mucous membranes are moist.     Dentition: Dental caries present.   Eyes:     General: No scleral icterus.       Right eye: No discharge.        Left eye: No discharge.     Conjunctiva/sclera: Conjunctivae normal.     Pupils: Pupils are equal, round, and reactive to light.  Neck:     Musculoskeletal: Normal range of motion.  Cardiovascular:     Rate and Rhythm: Normal rate.  Pulmonary:     Effort: Pulmonary effort is normal. No respiratory distress.  Abdominal:     General: There is no distension.  Skin:    General: Skin is warm and dry.  Neurological:     Mental Status: He is alert and oriented to person, place, and time.  Psychiatric:        Behavior: Behavior  normal.      ED Treatments / Results  Labs (all labs ordered are listed, but only abnormal results are displayed) Labs Reviewed - No data to display  EKG None  Radiology No results found.  Procedures Procedures (including critical care time)  Medications Ordered in ED Medications - No data to display   Initial Impression / Assessment and Plan / ED Course  I have reviewed the triage vital signs and the nursing notes.  Pertinent labs & imaging results that were available during my care of the patient were reviewed by me and considered in my medical decision making (see chart for details).  Pt presents with dental pain. Patient is afebrile, non toxic appearing, and swallowing secretions well. No concerning findings on exam. No obvious drainable abscess and doubt deep space head or neck infection. Will tx with antibiotics and NSAIDs. I gave patient referral to dentist and stressed the importance of dental follow up for ultimate management of dental pain. He states he is going to [facility] to have tooth pulled.   Final Clinical Impressions(s) / ED Diagnoses   Final diagnoses:  Pain, dental  Dental infection    ED Discharge Orders    None       Bethel Born, PA-C 06/30/19 3007    Vanetta Mulders, MD 07/02/19 [phone]

## 2019-06-30 NOTE — ED Triage Notes (Signed)
Left lower dental pain and swelling x 3 days.

## 2020-05-06 ENCOUNTER — Other Ambulatory Visit: Payer: Self-pay

## 2020-05-06 ENCOUNTER — Encounter (HOSPITAL_COMMUNITY): Payer: Self-pay | Admitting: Emergency Medicine

## 2020-05-06 ENCOUNTER — Emergency Department (HOSPITAL_COMMUNITY): Payer: Self-pay

## 2020-05-06 ENCOUNTER — Emergency Department (HOSPITAL_COMMUNITY)
Admission: EM | Admit: 2020-05-06 | Discharge: 2020-05-06 | Disposition: A | Discharge status: Home / Self Care | Payer: Self-pay | Attending: Emergency Medicine | Admitting: Emergency Medicine

## 2020-05-06 DIAGNOSIS — R1084 Generalized abdominal pain: Secondary | ICD-10-CM | POA: Insufficient documentation

## 2020-05-06 DIAGNOSIS — Z7982 Long term (current) use of aspirin: Secondary | ICD-10-CM | POA: Insufficient documentation

## 2020-05-06 DIAGNOSIS — R1030 Lower abdominal pain, unspecified: Secondary | ICD-10-CM

## 2020-05-06 DIAGNOSIS — R6883 Chills (without fever): Secondary | ICD-10-CM | POA: Insufficient documentation

## 2020-05-06 DIAGNOSIS — R112 Nausea with vomiting, unspecified: Secondary | ICD-10-CM | POA: Insufficient documentation

## 2020-05-06 LAB — COMPREHENSIVE METABOLIC PANEL
ALT: 14 U/L (ref 0–44)
AST: 16 U/L (ref 15–41)
Albumin: 3.8 g/dL (ref 3.5–5.0)
Alkaline Phosphatase: 41 U/L (ref 38–126)
Anion gap: 8 (ref 5–15)
BUN: 18 mg/dL (ref 6–20)
CO2: 24 mmol/L (ref 22–32)
Calcium: 9.1 mg/dL (ref 8.9–10.3)
Chloride: 104 mmol/L (ref 98–111)
Creatinine, Ser: 0.8 mg/dL (ref 0.61–1.24)
GFR calc non Af Amer: >60 mL/min (ref >60)
Glucose, Bld: 110 mg/dL — ABNORMAL HIGH (ref 70–99)
Potassium: 3.9 mmol/L (ref 3.5–5.1)
Sodium: 136 mmol/L (ref 135–145)
Total Bilirubin: 0.8 mg/dL (ref 0.3–1.2)
Total Protein: 6.8 g/dL (ref 6.5–8.1)

## 2020-05-06 LAB — CBC WITH DIFFERENTIAL/PLATELET
Abs Immature Granulocytes: 0.02 10*3/uL (ref 0.00–0.07)
Basophils Absolute: 0 10*3/uL (ref 0.0–0.1)
Basophils Relative: 1 %
Eosinophils Absolute: 0.3 10*3/uL (ref 0.0–0.5)
Eosinophils Relative: 4 %
HCT: 41.1 % (ref 39.0–52.0)
Hemoglobin: 15 g/dL (ref 13.0–17.0)
Immature Granulocytes: 0 %
Lymphocytes Relative: 20 %
Lymphs Abs: 1.5 10*3/uL (ref 0.7–4.0)
MCH: 32.1 pg (ref 26.0–34.0)
MCHC: 36.5 g/dL — ABNORMAL HIGH (ref 30.0–36.0)
MCV: 87.8 fL (ref 80.0–100.0)
Monocytes Absolute: 0.5 10*3/uL (ref 0.1–1.0)
Monocytes Relative: 7 %
Neutro Abs: 5.1 10*3/uL (ref 1.7–7.7)
Neutrophils Relative %: 68 %
Platelets: 120 10*3/uL — ABNORMAL LOW (ref 150–400)
RBC: 4.68 MIL/uL (ref 4.22–5.81)
RDW: 12 % (ref 11.5–15.5)
WBC: 7.4 10*3/uL (ref 4.0–10.5)
nRBC: 0 % (ref 0.0–0.2)

## 2020-05-06 LAB — URINALYSIS, ROUTINE W REFLEX MICROSCOPIC
Bacteria, UA: NONE SEEN
Bilirubin Urine: NEGATIVE
Glucose, UA: NEGATIVE mg/dL
Ketones, ur: NEGATIVE mg/dL
Leukocytes,Ua: NEGATIVE
Nitrite: NEGATIVE
Protein, ur: NEGATIVE mg/dL
Specific Gravity, Urine: 1.008 (ref 1.005–1.030)
pH: 6 (ref 5.0–8.0)

## 2020-05-06 LAB — LIPASE, BLOOD: Lipase: 30 U/L (ref 11–51)

## 2020-05-06 MED ORDER — MORPHINE SULFATE (PF) 4 MG/ML IV SOLN
4.0000 mg | Freq: Once | INTRAVENOUS | Status: AC
  Administered 2020-05-06: 4 mg via INTRAVENOUS
  Filled 2020-05-06: qty 1

## 2020-05-06 MED ORDER — DICYCLOMINE HCL 20 MG PO TABS: ORAL_TABLET | ORAL | 0 refills | Status: DC

## 2020-05-06 MED ORDER — IOHEXOL 300 MG/ML  SOLN
100.0000 mL | Freq: Once | INTRAMUSCULAR | Status: AC | PRN
  Administered 2020-05-06: 100 mL via INTRAVENOUS

## 2020-05-06 MED ORDER — ONDANSETRON HCL 4 MG/2ML IJ SOLN
4.0000 mg | Freq: Once | INTRAMUSCULAR | Status: AC
  Administered 2020-05-06: 4 mg via INTRAVENOUS
  Filled 2020-05-06: qty 2

## 2020-05-06 MED ORDER — SODIUM CHLORIDE 0.9 % IV BOLUS
1000.0000 mL | Freq: Once | INTRAVENOUS | Status: AC
  Administered 2020-05-06: 1000 mL via INTRAVENOUS

## 2020-05-06 MED ORDER — ONDANSETRON 4 MG PO TBDP: ORAL_TABLET | ORAL | 0 refills | Status: DC

## 2020-05-06 NOTE — ED Notes (Signed)
Pt to CT

## 2020-05-06 NOTE — ED Provider Notes (Signed)
[facility] EMERGENCY DEPARTMENT Provider Note   CSN: [id number] Arrival date & time: 05/06/20  9735     History Chief Complaint  Patient presents with  . Abdominal Pain    Todd Reilly is a 54 y.o. male.  Pt presents to the ED today with abd pain and n/v.  Pt had a left inguinal hernia repair in 2014.  Since then, he's had periodic episodes of abd pain.  However, since Friday (10/1), he's had n/v and worsening abd pain.  He has had sweats and chills.  No fevers. He called his pcp office and they told him to come here.          Past Medical History:  Diagnosis Date  . Inguinal hernia     There are no problems to display for this patient.   Past Surgical History:  Procedure Laterality Date  . INGUINAL HERNIA REPAIR Left 02/14/2013   Procedure: HERNIA REPAIR INGUINAL INCARCERATED;  Surgeon: Fabio Bering, MD;  Location: [facility];  Service: General;  Laterality: Left;  . INSERTION OF MESH Left 02/14/2013   Procedure: INSERTION OF MESH;  Surgeon: Fabio Bering, MD;  Location: [facility];  Service: General;  Laterality: Left;  . OMENTECTOMY N/A 02/14/2013   Procedure: PARTIAL OMENTECTOMY;  Surgeon: Fabio Bering, MD;  Location: [facility];  Service: General;  Laterality: N/A;       No family history on file.  Social History   Tobacco Use  . Smoking status: Never Smoker  . Smokeless tobacco: Never Used  Substance Use Topics  . Alcohol use: No  . Drug use: No    Home Medications Prior to Admission medications   Medication Sig Start Date End Date Taking? Authorizing Provider  aspirin 325 MG tablet Take 325 mg by mouth daily.    [provider]  ibuprofen (ADVIL) 800 MG tablet Take 1 tablet (800 mg total) by mouth 3 (three) times daily. 06/30/19   Bethel Born, PA-C  Omega-3 Fatty Acids (FISH OIL) 1000 MG CAPS Take 1 capsule by mouth daily.    [provider]  potassium chloride SA (K-DUR,KLOR-CON) 20 MEQ tablet Take 1 tablet (20 mEq total) by mouth  2 (two) times daily. 12/06/17   Burgess Amor, PA-C  traMADol (ULTRAM) 50 MG tablet Take 1 tablet (50 mg total) by mouth every 6 (six) hours as needed. 10/07/18   Burgess Amor, PA-C    Allergies    Patient has no known allergies.  Review of Systems   Review of Systems  Constitutional: Positive for chills.  Gastrointestinal: Positive for abdominal pain, nausea and vomiting.  All other systems reviewed and are negative.   Physical Exam Updated Vital Signs BP 125/89   Pulse 72   Temp 97.6 F (36.4 C) (Oral)   Resp 18   Ht 5\' 10"  (1.778 m)   Wt 78.9 kg   SpO2 99%   BMI 24.97 kg/m   Physical Exam Vitals and nursing note reviewed.  Constitutional:      Appearance: He is well-developed.  HENT:     Head: Normocephalic and atraumatic.     Mouth/Throat:     Mouth: Mucous membranes are moist.     Pharynx: Oropharynx is clear.  Eyes:     Extraocular Movements: Extraocular movements intact.  Cardiovascular:     Rate and Rhythm: Normal rate and regular rhythm.     Heart sounds: Normal heart sounds.  Pulmonary:     Effort: Pulmonary effort is  normal.     Breath sounds: Normal breath sounds.  Abdominal:     General: Abdomen is flat. Bowel sounds are normal.     Palpations: Abdomen is soft.     Tenderness: There is generalized abdominal tenderness.  Skin:    General: Skin is warm.     Capillary Refill: Capillary refill takes less than 2 seconds.  Neurological:     General: No focal deficit present.     Mental Status: He is alert and oriented to person, place, and time.  Psychiatric:        Mood and Affect: Mood normal.        Behavior: Behavior normal.     ED Results / Procedures / Treatments   Labs (all labs ordered are listed, but only abnormal results are displayed) Labs Reviewed  CBC WITH DIFFERENTIAL/PLATELET  COMPREHENSIVE METABOLIC PANEL  LIPASE, BLOOD  URINALYSIS, ROUTINE W REFLEX MICROSCOPIC    EKG None  Radiology No results found.  Procedures Procedures  (including critical care time)  Medications Ordered in ED Medications  sodium chloride 0.9 % bolus 1,000 mL (1,000 mLs Intravenous New Bag/Given 05/06/20 0623)  morphine 4 MG/ML injection 4 mg (4 mg Intravenous Given 05/06/20 0624)  ondansetron (ZOFRAN) injection 4 mg (4 mg Intravenous Given 05/06/20 2330)    ED Course  I have reviewed the triage vital signs and the nursing notes.  Pertinent labs & imaging results that were available during my care of the patient were reviewed by me and considered in my medical decision making (see chart for details).    MDM Rules/Calculators/A&P                          Labs and CT scans are pending at shift change.  Pt signed out to Dr. Estell Harpin. Final Clinical Impression(s) / ED Diagnoses Final diagnoses:  Generalized abdominal pain  Non-intractable vomiting with nausea, unspecified vomiting type    Rx / DC Orders ED Discharge Orders    None       Jacalyn Lefevre, MD 05/06/20 [phone]

## 2020-05-06 NOTE — ED Triage Notes (Signed)
Pt states that since his hernia surgery he has recurrent periods of vomiting and abdominal pain.

## 2020-05-06 NOTE — ED Notes (Signed)
Pt arrived back from CT

## 2020-05-06 NOTE — ED Notes (Signed)
Assumed care of pt at 0700 

## 2020-05-06 NOTE — Discharge Instructions (Signed)
Follow up with dr. Melvyn Neth in 1-2 weeks

## 2020-05-18 ENCOUNTER — Encounter: Payer: Self-pay | Admitting: Internal Medicine

## 2020-05-21 ENCOUNTER — Other Ambulatory Visit: Payer: Self-pay

## 2020-05-21 ENCOUNTER — Emergency Department (HOSPITAL_COMMUNITY): Payer: Self-pay

## 2020-05-21 ENCOUNTER — Emergency Department (HOSPITAL_COMMUNITY)
Admission: EM | Admit: 2020-05-21 | Discharge: 2020-05-21 | Disposition: A | Discharge status: Home / Self Care | Payer: Self-pay | Attending: Emergency Medicine | Admitting: Emergency Medicine

## 2020-05-21 DIAGNOSIS — Z7982 Long term (current) use of aspirin: Secondary | ICD-10-CM | POA: Insufficient documentation

## 2020-05-21 DIAGNOSIS — R1032 Left lower quadrant pain: Secondary | ICD-10-CM | POA: Insufficient documentation

## 2020-05-21 LAB — CBC WITH DIFFERENTIAL/PLATELET
Abs Immature Granulocytes: 0.03 10*3/uL (ref 0.00–0.07)
Basophils Absolute: 0 10*3/uL (ref 0.0–0.1)
Basophils Relative: 0 %
Eosinophils Absolute: 0 10*3/uL (ref 0.0–0.5)
Eosinophils Relative: 0 %
HCT: 41 % (ref 39.0–52.0)
Hemoglobin: 14.9 g/dL (ref 13.0–17.0)
Immature Granulocytes: 0 %
Lymphocytes Relative: 11 %
Lymphs Abs: 0.8 10*3/uL (ref 0.7–4.0)
MCH: 31.4 pg (ref 26.0–34.0)
MCHC: 36.3 g/dL — ABNORMAL HIGH (ref 30.0–36.0)
MCV: 86.5 fL (ref 80.0–100.0)
Monocytes Absolute: 0.4 10*3/uL (ref 0.1–1.0)
Monocytes Relative: 6 %
Neutro Abs: 6 10*3/uL (ref 1.7–7.7)
Neutrophils Relative %: 83 %
Platelets: 141 10*3/uL — ABNORMAL LOW (ref 150–400)
RBC: 4.74 MIL/uL (ref 4.22–5.81)
RDW: 11.9 % (ref 11.5–15.5)
WBC: 7.3 10*3/uL (ref 4.0–10.5)
nRBC: 0 % (ref 0.0–0.2)

## 2020-05-21 LAB — LIPASE, BLOOD: Lipase: 21 U/L (ref 11–51)

## 2020-05-21 LAB — COMPREHENSIVE METABOLIC PANEL
ALT: 18 U/L (ref 0–44)
AST: 25 U/L (ref 15–41)
Albumin: 4.3 g/dL (ref 3.5–5.0)
Alkaline Phosphatase: 43 U/L (ref 38–126)
Anion gap: 12 (ref 5–15)
BUN: 12 mg/dL (ref 6–20)
CO2: 20 mmol/L — ABNORMAL LOW (ref 22–32)
Calcium: 9.2 mg/dL (ref 8.9–10.3)
Chloride: 102 mmol/L (ref 98–111)
Creatinine, Ser: 0.72 mg/dL (ref 0.61–1.24)
GFR, Estimated: >60 mL/min (ref >60)
Glucose, Bld: 99 mg/dL (ref 70–99)
Potassium: 3.7 mmol/L (ref 3.5–5.1)
Sodium: 134 mmol/L — ABNORMAL LOW (ref 135–145)
Total Bilirubin: 2.2 mg/dL — ABNORMAL HIGH (ref 0.3–1.2)
Total Protein: 7.3 g/dL (ref 6.5–8.1)

## 2020-05-21 LAB — URINALYSIS, ROUTINE W REFLEX MICROSCOPIC
Bilirubin Urine: NEGATIVE
Glucose, UA: NEGATIVE mg/dL
Hgb urine dipstick: NEGATIVE
Ketones, ur: 20 mg/dL — AB
Leukocytes,Ua: NEGATIVE
Nitrite: NEGATIVE
Protein, ur: NEGATIVE mg/dL
Specific Gravity, Urine: 1.01 (ref 1.005–1.030)
pH: 8 (ref 5.0–8.0)

## 2020-05-21 MED ORDER — DICYCLOMINE HCL 20 MG PO TABS: 20.0000 mg | ORAL_TABLET | Freq: Two times a day (BID) | ORAL | 0 refills | Status: DC

## 2020-05-21 NOTE — ED Triage Notes (Signed)
Abdominal pain with vomiting intermittent for 2 weeks

## 2020-05-21 NOTE — Discharge Instructions (Addendum)
The x-ray and ultrasound of your abdomen today are reassuring.  Your ultrasound does not show evidence of problems with your gallbladder.  Your blood work and urine were also reassuring.  Continue your bland diet as directed.  Call Dr. Luvenia Starch office on Monday to arrange a follow-up appointment.

## 2020-05-21 NOTE — ED Notes (Signed)
To ultrasound

## 2020-05-21 NOTE — ED Provider Notes (Signed)
[facility] EMERGENCY DEPARTMENT Provider Note   CSN: [id number] Arrival date & time: 05/21/20  1134     History Chief Complaint  Patient presents with  . Abdominal Pain    Todd Reilly is a 54 y.o. male.  HPI      Todd Reilly is a 54 y.o. male who presents to the Emergency Department complaining of left lower abdominal pain that has been intermittent for 2 weeks.  He describes having sharp stabbing pains to his left lower abdomen that doubled him over.  Pain is associated with attempting to defecate.  He states the pain is so severe that he becomes nauseous and begins to throw up.  At times, he states he is able to have a bowel movement without difficulty but other times he has a significant urge, but is unable to move his bowels.  He reports inguinal hernia repair in 2014 with placement of mesh.  He was seen here on 05/06/2020 for similar symptoms.  He states he was given Bentyl which helps with feelings of spasms in his stomach, but he continues to have pain.  He saw his primary care provider and was advised to come to the emergency room for possible evaluation of his gallbladder.  He denies black or bloody stool.  He endorses having sweats and chills when the pain is severe, he denies fever, chest pain, shortness of breath, back pain and dysuria. No screening colonscopy   Past Medical History:  Diagnosis Date  . Inguinal hernia     There are no problems to display for this patient.   Past Surgical History:  Procedure Laterality Date  . INGUINAL HERNIA REPAIR Left 02/14/2013   Procedure: HERNIA REPAIR INGUINAL INCARCERATED;  Surgeon: Fabio Bering, MD;  Location: [facility];  Service: General;  Laterality: Left;  . INSERTION OF MESH Left 02/14/2013   Procedure: INSERTION OF MESH;  Surgeon: Fabio Bering, MD;  Location: [facility];  Service: General;  Laterality: Left;  . OMENTECTOMY N/A 02/14/2013   Procedure: PARTIAL OMENTECTOMY;  Surgeon: Fabio Bering, MD;  Location: [facility];   Service: General;  Laterality: N/A;       No family history on file.  Social History   Tobacco Use  . Smoking status: Never Smoker  . Smokeless tobacco: Never Used  Substance Use Topics  . Alcohol use: No  . Drug use: No    Home Medications Prior to Admission medications   Medication Sig Start Date End Date Taking? Authorizing Provider  aspirin 325 MG tablet Take 325 mg by mouth daily.    [provider]  dicyclomine (BENTYL) 20 MG tablet One every 8 hours for abdominal cramps and pain 05/06/20   Bethann Berkshire, MD  ibuprofen (ADVIL) 800 MG tablet Take 1 tablet (800 mg total) by mouth 3 (three) times daily. 06/30/19   Bethel Born, PA-C  Omega-3 Fatty Acids (FISH OIL) 1000 MG CAPS Take 1 capsule by mouth daily.    [provider]  ondansetron (ZOFRAN ODT) 4 MG disintegrating tablet 4mg  ODT q4 hours prn nausea/vomit 05/06/20   07/06/20, MD  potassium chloride SA (K-DUR,KLOR-CON) 20 MEQ tablet Take 1 tablet (20 mEq total) by mouth 2 (two) times daily. 12/06/17   02/05/18, PA-C  traMADol (ULTRAM) 50 MG tablet Take 1 tablet (50 mg total) by mouth every 6 (six) hours as needed. 10/07/18   12/07/18, PA-C    Allergies    Patient has no known allergies.  Review of Systems   Review of Systems  Constitutional: Negative for appetite change, chills and fever.  Respiratory: Negative for shortness of breath.   Cardiovascular: Negative for chest pain.  Gastrointestinal: Positive for abdominal pain, constipation, nausea and vomiting. Negative for abdominal distention and blood in stool.  Genitourinary: Negative for decreased urine volume, difficulty urinating, dysuria and flank pain.  Musculoskeletal: Negative for back pain.  Skin: Negative for color change and rash.  Neurological: Negative for dizziness, weakness and numbness.  Hematological: Negative for adenopathy.    Physical Exam Updated Vital Signs BP 120/76 (BP Location: Right Arm)   Pulse 86    Resp 16   Ht 5\' 10"  (1.778 m)   Wt 76.7 kg   SpO2 100%   BMI 24.25 kg/m   Physical Exam Vitals and nursing note reviewed.  Constitutional:      General: He is not in acute distress.    Appearance: He is well-developed. He is not ill-appearing or toxic-appearing.  HENT:     Mouth/Throat:     Mouth: Mucous membranes are moist.  Cardiovascular:     Rate and Rhythm: Normal rate and regular rhythm.     Pulses: Normal pulses.  Pulmonary:     Effort: Pulmonary effort is normal.     Breath sounds: Normal breath sounds.  Abdominal:     General: There is no distension.     Tenderness: There is abdominal tenderness in the left lower quadrant. There is no right CVA tenderness, left CVA tenderness, guarding or rebound.     Comments: Focal ttp of the LLQ.  Small, firm mass that's palpable to the left inguinal area.  No fluctuance or surrounding erythema.  abd soft, non distended. No guarding or rebound  Musculoskeletal:        General: Normal range of motion.  Skin:    General: Skin is warm.     Capillary Refill: Capillary refill takes less than 2 seconds.  Neurological:     Mental Status: He is alert.     Sensory: No sensory deficit.     Motor: No weakness.     ED Results / Procedures / Treatments   Labs (all labs ordered are listed, but only abnormal results are displayed) Labs Reviewed  COMPREHENSIVE METABOLIC PANEL - Abnormal; Notable for the following components:      Result Value   Sodium 134 (*)    CO2 20 (*)    Total Bilirubin 2.2 (*)    All other components within normal limits  URINALYSIS, ROUTINE W REFLEX MICROSCOPIC - Abnormal; Notable for the following components:   Ketones, ur 20 (*)    All other components within normal limits  CBC WITH DIFFERENTIAL/PLATELET - Abnormal; Notable for the following components:   MCHC 36.3 (*)    Platelets 141 (*)    All other components within normal limits  LIPASE, BLOOD  CBC WITH DIFFERENTIAL/PLATELET     EKG None  Radiology Abdomen Complete  Result Date: 05/21/2020 CLINICAL DATA:  Abdominal pain, vomiting. EXAM: ABDOMEN ULTRASOUND COMPLETE COMPARISON:  Radiographs of the abdomen performed earlier the same day 05/21/2020. CT abdomen/pelvis 05/06/2020. FINDINGS: Gallbladder: No gallstones or wall thickening visualized. No sonographic Murphy sign noted by sonographer. Common bile duct: Diameter: 3-4 mm, within normal limits. Liver: No focal lesion identified. Within normal limits in parenchymal echogenicity. Portal vein is patent on color Doppler imaging with normal direction of blood flow towards the liver. IVC: No abnormality visualized. Pancreas: Visualized portion unremarkable. Spleen: Size  and appearance within normal limits. Right Kidney: Length: 10.9 cm. Echogenicity within normal limits. No mass or hydronephrosis visualized. Left Kidney: Length: 10.6 cm. Echogenicity within normal limits. No mass or hydronephrosis visualized. Abdominal aorta: No aneurysm visualized. IMPRESSION: Unremarkable abdominal ultrasound as described. Electronically Signed   By: Jackey Loge DO   On: 05/21/2020 14:56   DG Abdomen Acute W/Chest  Result Date: 05/21/2020 CLINICAL DATA:  Left-sided abdominal pain and vomiting EXAM: DG ABDOMEN ACUTE WITH 1 VIEW CHEST COMPARISON:  05/06/2020 FINDINGS: There is no evidence of dilated bowel loops or free intraperitoneal air. No radiopaque calculi or other significant radiographic abnormality is seen. Heart size and mediastinal contours are within normal limits. Both lungs are clear. IMPRESSION: Negative abdominal radiographs.  No acute cardiopulmonary disease. Electronically Signed   By: Duanne Guess D.O.   On: 05/21/2020 13:57    Procedures Procedures (including critical care time)  Medications Ordered in ED Medications - No data to display  ED Course  I have reviewed the triage vital signs and the nursing notes.  Pertinent labs & imaging results that were  available during my care of the patient were reviewed by me and considered in my medical decision making (see chart for details).    MDM Rules/Calculators/A&P                          Pt returns here under advisement of PCP for evaluation of abdominal pain and vomiting.  Patient states they were concerned that he may have issues with his gallbladder.  Sx's have been waxing and waning for 2 weeks.  Seen here 05/16/20 for same.  CT abd/pelvis at that time showed diverticulosis without diverticulitis, otherwise no acute findings.  On exam today, he has focal tenderness of the left lower quadrant.  Abdomen is soft there is no guarding or rebound tenderness.  No active vomiting at this time.  Given his prior inguinal hernia repair, consider bowel obstruction although he endorses being able to have bowel movements at times.  Overall, he is well-appearing, nontoxic.  Doubt acute abdominal process.  Given recent CT of the abdomen pelvis that did not show evidence for diverticulitis, and doubtful that repeating CT today would provide much benefit.  We will repeat labs, plain film of the abdomen and ultrasound of gallbladder.  1635  Consulted GI, Dr. Jena Gauss and discussed findings, he agrees to see this pt in office for f/u  Final Clinical Impression(s) / ED Diagnoses Final diagnoses:  Left lower quadrant abdominal pain    Rx / DC Orders ED Discharge Orders    None       Pauline Aus, PA-C 05/21/20 1642    Terrilee Files, MD 05/21/20 [phone]

## 2020-05-21 NOTE — ED Notes (Signed)
Todd Reilly(contact person) [phone]

## 2020-05-22 ENCOUNTER — Telehealth: Payer: Self-pay | Admitting: Internal Medicine

## 2020-05-22 NOTE — Telephone Encounter (Signed)
Todd Reilly called me last night about this gentleman in ED last night.  Left lower quadrant abdominal pain difficulty having a bowel movement.  Nothing acute on recent ultrasound CT. He remains an outpatient and request for [place] to see him in the office was made.  I agreed.  I recommended that he call us the first of next week and we will establish an appointment.

## 2020-05-24 NOTE — Telephone Encounter (Signed)
Noted  

## 2020-06-03 ENCOUNTER — Encounter: Payer: Self-pay | Admitting: *Deleted

## 2020-06-03 ENCOUNTER — Ambulatory Visit: Payer: Self-pay | Admitting: Internal Medicine

## 2020-06-03 ENCOUNTER — Other Ambulatory Visit: Payer: Self-pay

## 2020-06-03 ENCOUNTER — Encounter: Payer: Self-pay | Admitting: Internal Medicine

## 2020-06-03 DIAGNOSIS — K5900 Constipation, unspecified: Secondary | ICD-10-CM

## 2020-06-03 DIAGNOSIS — R1032 Left lower quadrant pain: Secondary | ICD-10-CM

## 2020-06-03 DIAGNOSIS — R112 Nausea with vomiting, unspecified: Secondary | ICD-10-CM

## 2020-06-03 MED ORDER — ONDANSETRON 4 MG PO TBDP: ORAL_TABLET | ORAL | 2 refills | Status: DC

## 2020-06-03 MED ORDER — DICYCLOMINE HCL 20 MG PO TABS: 20.0000 mg | ORAL_TABLET | Freq: Two times a day (BID) | ORAL | 2 refills | Status: DC

## 2020-06-03 NOTE — Patient Instructions (Signed)
We will schedule you for EGD and colonoscopy to further evaluate your symptoms.  Continue dicyclomine twice daily, refill sent to your pharmacy.  Continue Zofran as needed for nausea and vomiting.  I would add MiraLAX 1 capful up to 4 times daily until having more regular bowel movements.  Further recommendations to follow.  At [facility] we value your feedback. You may receive a survey about your visit today. Please share your experience as we strive to create trusting relationships with our patients to provide genuine, compassionate, quality care.  We appreciate your understanding and patience as we review any laboratory studies, imaging, and other diagnostic tests that are ordered as we care for you. Our office policy is 5 business days for review of these results, and any emergent or urgent results are addressed in a timely manner for your best interest. If you do not hear from our office in 1 week, please contact us.   We also encourage the use of MyChart, which contains your medical information for your review as well. If you are not enrolled in this feature, an access code is on this after visit summary for your convenience. Thank you for allowing [place] to be involved in your care.  It was great to see you today!  I hope you have a great rest of your fall!!   Elianny Buxbaum K. Marletta Lor, D.O. Gastroenterology and Hepatology [facility]

## 2020-06-03 NOTE — H&P (View-Only) (Signed)
Primary Care Physician:  Elfredia Nevins, MD Primary Gastroenterologist:  Dr. Marletta Lor  Chief Complaint  Patient presents with  . Abdominal Pain    LLQ, comes/goes. Had hernia repair w/ mesh placed in 2014. Noticed symtpoms start 6 months afterwards. PCP advised may need gallbladder removed  . Nausea    w/ vomiting, comes/goes, hard to eat  . Constipation    3-4 days w/o BM    HPI:   STEELE Reilly is a 54 y.o. male who presents to the clinic today by referral from his PCP Dr. Carlena Sax for evaluation. Patient has numerous GI complaints for me today. He states he has had progressively worsening left lower quadrant abdominal pain. Notes hernia repair in the past in the same region. Also states he is feels as though his chronic constipated which is new for him. Notes 1 bowel movement every 3 days or so with small hard stool balls. No melena or hematochezia. Also has recurrent nausea and vomiting. States that eating will make it worse. He is cut out all dairy without improvement in symptoms. Has also cut out meats as well. No previous EGD or colonoscopy. Does note previous chronic NSAID use with Aleve but states he stopped this. Has been started on dicyclomine which he states is helping immensely. Also has as needed Zofran for nausea. He is requesting refills of both these medications today. Does note some weight loss. No family history of colorectal malignancy. Otherwise no other complaints.  Past Medical History:  Diagnosis Date  . Inguinal hernia     Past Surgical History:  Procedure Laterality Date  . INGUINAL HERNIA REPAIR Left 02/14/2013   Procedure: HERNIA REPAIR INGUINAL INCARCERATED;  Surgeon: Fabio Bering, MD;  Location: [facility];  Service: General;  Laterality: Left;  . INSERTION OF MESH Left 02/14/2013   Procedure: INSERTION OF MESH;  Surgeon: Fabio Bering, MD;  Location: [facility];  Service: General;  Laterality: Left;  . OMENTECTOMY N/A 02/14/2013   Procedure: PARTIAL OMENTECTOMY;   Surgeon: Fabio Bering, MD;  Location: [facility];  Service: General;  Laterality: N/A;    Current Outpatient Medications  Medication Sig Dispense Refill  . ALPRAZolam (XANAX) 1 MG tablet Take 1 mg by mouth 3 (three) times daily as needed.    . Aspirin-Salicylamide-Caffeine (BC HEADACHE POWDER PO) Take by mouth as needed.    . dicyclomine (BENTYL) 20 MG tablet Take 1 tablet (20 mg total) by mouth 2 (two) times daily. (Patient taking differently: Take 20 mg by mouth as needed. ) 20 tablet 0  . Omega-3 Fatty Acids (FISH OIL) 1000 MG CAPS Take 1 capsule by mouth daily.    . ondansetron (ZOFRAN ODT) 4 MG disintegrating tablet 4mg  ODT q4 hours prn nausea/vomit 12 tablet 0   No current facility-administered medications for this visit.    Allergies as of 06/03/2020  . (No Known Allergies)    No family history on file.  Social History   Socioeconomic History  . Marital status: Single    Spouse name: Not on file  . Number of children: Not on file  . Years of education: Not on file  . Highest education level: Not on file  Occupational History  . Not on file  Tobacco Use  . Smoking status: Former 13/10/2019  . Smokeless tobacco: Never Used  . Tobacco comment: quit 2005/2006  Substance and Sexual Activity  . Alcohol use: Yes    Comment: occas  . Drug use: No  .  Sexual activity: Not on file  Other Topics Concern  . Not on file  Social History Narrative  . Not on file   Social Determinants of Health   Financial Resource Strain:   . Difficulty of Paying Living Expenses: Not on file  Food Insecurity:   . Worried About Programme researcher, broadcasting/film/video in the Last Year: Not on file  . Ran Out of Food in the Last Year: Not on file  Transportation Needs:   . Lack of Transportation (Medical): Not on file  . Lack of Transportation (Non-Medical): Not on file  Physical Activity:   . Days of Exercise per Week: Not on file  . Minutes of Exercise per Session: Not on file  Stress:   . Feeling of Stress :  Not on file  Social Connections:   . Frequency of Communication with Friends and Family: Not on file  . Frequency of Social Gatherings with Friends and Family: Not on file  . Attends Religious Services: Not on file  . Active Member of Clubs or Organizations: Not on file  . Attends Banker Meetings: Not on file  . Marital Status: Not on file  Intimate Partner Violence:   . Fear of Current or Ex-Partner: Not on file  . Emotionally Abused: Not on file  . Physically Abused: Not on file  . Sexually Abused: Not on file    Subjective: Review of Systems  Constitutional: Negative for chills and fever.  HENT: Negative for congestion and hearing loss.   Eyes: Negative for blurred vision and double vision.  Respiratory: Negative for cough and shortness of breath.   Cardiovascular: Negative for chest pain and palpitations.  Gastrointestinal: Positive for abdominal pain, constipation, nausea and vomiting. Negative for blood in stool, diarrhea, heartburn and melena.  Genitourinary: Negative for dysuria and urgency.  Musculoskeletal: Negative for joint pain and myalgias.  Skin: Negative for itching and rash.  Neurological: Negative for dizziness and headaches.  Psychiatric/Behavioral: Negative for depression. The patient is not nervous/anxious.        Objective: BP 140/88   Pulse 86   Temp (!) 97.5 F (36.4 C)   Ht 5\' 10"  (1.778 m)   Wt 170 lb (77.1 kg)   BMI 24.39 kg/m  Physical Exam Constitutional:      Appearance: Normal appearance.  HENT:     Head: Normocephalic and atraumatic.  Eyes:     Extraocular Movements: Extraocular movements intact.     Conjunctiva/sclera: Conjunctivae normal.  Cardiovascular:     Rate and Rhythm: Normal rate and regular rhythm.  Pulmonary:     Effort: Pulmonary effort is normal.     Breath sounds: Normal breath sounds.  Abdominal:     General: Bowel sounds are normal.     Palpations: Abdomen is soft.  Musculoskeletal:         General: Normal range of motion.     Cervical back: Normal range of motion and neck supple.  Skin:    General: Skin is warm.  Neurological:     General: No focal deficit present.     Mental Status: He is alert and oriented to person, place, and time.  Psychiatric:        Mood and Affect: Mood normal.        Behavior: Behavior normal.      Assessment: *Abdominal pain-LLQ *Nausea and vomiting *Constipation *Weight loss  Plan: Etiology of patient's symptoms unclear. Will schedule for EGD to evaluate for peptic ulcer disease, esophagitis, gastritis,  H. Pylori, duodenitis, or other. Will also evaluate for esophageal stricture, Schatzki's ring, esophageal web or other.   At the same time we will perform colonoscopy to evaluate patient's left lower quadrant pain to rule out diverticulitis, underlying inflammatory bowel disease such as Crohn's disease or ulcerative colitis, polyps, malignancy, or other.  The risks including infection, bleed, or perforation as well as benefits, limitations, alternatives and imponderables have been reviewed with the patient. Potential for esophageal dilation, biopsy, etc. have also been reviewed.  Questions have been answered. All parties agreeable.  Continue on dicyclomine twice daily.  Refilled in office today.  Continue on as needed Zofran for nausea and vomiting, refilled in office today.  Further recommendations to follow.  Thank you Dr. Carlena Sax for the kind referral. 06/03/2020 8:42 AM   Disclaimer: This note was dictated with voice recognition software. Similar sounding words can inadvertently be transcribed and may not be corrected upon review.

## 2020-06-03 NOTE — Progress Notes (Signed)
Primary Care Physician:  Todd Nevins, MD Primary Gastroenterologist:  Dr. Marletta Reilly  Chief Complaint  Patient presents with  . Abdominal Pain    LLQ, comes/goes. Had hernia repair w/ mesh placed in 2014. Noticed symtpoms start 6 months afterwards. PCP advised may need gallbladder removed  . Nausea    w/ vomiting, comes/goes, hard to eat  . Constipation    3-4 days w/o BM    HPI:   STEELE Reilly is a 54 y.o. male who presents to the clinic today by referral from his PCP Dr. Carlena Sax for evaluation. Patient has numerous GI complaints for me today. He states he has had progressively worsening left lower quadrant abdominal pain. Notes hernia repair in the past in the same region. Also states he is feels as though his chronic constipated which is new for him. Notes 1 bowel movement every 3 days or so with small hard stool balls. No melena or hematochezia. Also has recurrent nausea and vomiting. States that eating will make it worse. He is cut out all dairy without improvement in symptoms. Has also cut out meats as well. No previous EGD or colonoscopy. Does note previous chronic NSAID use with Aleve but states he stopped this. Has been started on dicyclomine which he states is helping immensely. Also has as needed Zofran for nausea. He is requesting refills of both these medications today. Does note some weight loss. No family history of colorectal malignancy. Otherwise no other complaints.  Past Medical History:  Diagnosis Date  . Inguinal hernia     Past Surgical History:  Procedure Laterality Date  . INGUINAL HERNIA REPAIR Left 02/14/2013   Procedure: HERNIA REPAIR INGUINAL INCARCERATED;  Surgeon: Fabio Bering, MD;  Location: [facility];  Service: General;  Laterality: Left;  . INSERTION OF MESH Left 02/14/2013   Procedure: INSERTION OF MESH;  Surgeon: Fabio Bering, MD;  Location: [facility];  Service: General;  Laterality: Left;  . OMENTECTOMY N/A 02/14/2013   Procedure: PARTIAL OMENTECTOMY;   Surgeon: Fabio Bering, MD;  Location: [facility];  Service: General;  Laterality: N/A;    Current Outpatient Medications  Medication Sig Dispense Refill  . ALPRAZolam (XANAX) 1 MG tablet Take 1 mg by mouth 3 (three) times daily as needed.    . Aspirin-Salicylamide-Caffeine (BC HEADACHE POWDER PO) Take by mouth as needed.    . dicyclomine (BENTYL) 20 MG tablet Take 1 tablet (20 mg total) by mouth 2 (two) times daily. (Patient taking differently: Take 20 mg by mouth as needed. ) 20 tablet 0  . Omega-3 Fatty Acids (FISH OIL) 1000 MG CAPS Take 1 capsule by mouth daily.    . ondansetron (ZOFRAN ODT) 4 MG disintegrating tablet 4mg  ODT q4 hours prn nausea/vomit 12 tablet 0   No current facility-administered medications for this visit.    Allergies as of 06/03/2020  . (No Known Allergies)    No family history on file.  Social History   Socioeconomic History  . Marital status: Single    Spouse name: Not on file  . Number of children: Not on file  . Years of education: Not on file  . Highest education level: Not on file  Occupational History  . Not on file  Tobacco Use  . Smoking status: Former 13/10/2019  . Smokeless tobacco: Never Used  . Tobacco comment: quit 2005/2006  Substance and Sexual Activity  . Alcohol use: Yes    Comment: occas  . Drug use: No  .  Sexual activity: Not on file  Other Topics Concern  . Not on file  Social History Narrative  . Not on file   Social Determinants of Health   Financial Resource Strain:   . Difficulty of Paying Living Expenses: Not on file  Food Insecurity:   . Worried About Programme researcher, broadcasting/film/video in the Last Year: Not on file  . Ran Out of Food in the Last Year: Not on file  Transportation Needs:   . Lack of Transportation (Medical): Not on file  . Lack of Transportation (Non-Medical): Not on file  Physical Activity:   . Days of Exercise per Week: Not on file  . Minutes of Exercise per Session: Not on file  Stress:   . Feeling of Stress :  Not on file  Social Connections:   . Frequency of Communication with Friends and Family: Not on file  . Frequency of Social Gatherings with Friends and Family: Not on file  . Attends Religious Services: Not on file  . Active Member of Clubs or Organizations: Not on file  . Attends Banker Meetings: Not on file  . Marital Status: Not on file  Intimate Partner Violence:   . Fear of Current or Ex-Partner: Not on file  . Emotionally Abused: Not on file  . Physically Abused: Not on file  . Sexually Abused: Not on file    Subjective: Review of Systems  Constitutional: Negative for chills and fever.  HENT: Negative for congestion and hearing loss.   Eyes: Negative for blurred vision and double vision.  Respiratory: Negative for cough and shortness of breath.   Cardiovascular: Negative for chest pain and palpitations.  Gastrointestinal: Positive for abdominal pain, constipation, nausea and vomiting. Negative for blood in stool, diarrhea, heartburn and melena.  Genitourinary: Negative for dysuria and urgency.  Musculoskeletal: Negative for joint pain and myalgias.  Skin: Negative for itching and rash.  Neurological: Negative for dizziness and headaches.  Psychiatric/Behavioral: Negative for depression. The patient is not nervous/anxious.        Objective: BP 140/88   Pulse 86   Temp (!) 97.5 F (36.4 C)   Ht 5\' 10"  (1.778 m)   Wt 170 lb (77.1 kg)   BMI 24.39 kg/m  Physical Exam Constitutional:      Appearance: Normal appearance.  HENT:     Head: Normocephalic and atraumatic.  Eyes:     Extraocular Movements: Extraocular movements intact.     Conjunctiva/sclera: Conjunctivae normal.  Cardiovascular:     Rate and Rhythm: Normal rate and regular rhythm.  Pulmonary:     Effort: Pulmonary effort is normal.     Breath sounds: Normal breath sounds.  Abdominal:     General: Bowel sounds are normal.     Palpations: Abdomen is soft.  Musculoskeletal:         General: Normal range of motion.     Cervical back: Normal range of motion and neck supple.  Skin:    General: Skin is warm.  Neurological:     General: No focal deficit present.     Mental Status: He is alert and oriented to person, place, and time.  Psychiatric:        Mood and Affect: Mood normal.        Behavior: Behavior normal.      Assessment: *Abdominal pain-LLQ *Nausea and vomiting *Constipation *Weight loss  Plan: Etiology of patient's symptoms unclear. Will schedule for EGD to evaluate for peptic ulcer disease, esophagitis, gastritis,  H. Pylori, duodenitis, or other. Will also evaluate for esophageal stricture, Schatzki's ring, esophageal web or other.   At the same time we will perform colonoscopy to evaluate patient's left lower quadrant pain to rule out diverticulitis, underlying inflammatory bowel disease such as Crohn's disease or ulcerative colitis, polyps, malignancy, or other.  The risks including infection, bleed, or perforation as well as benefits, limitations, alternatives and imponderables have been reviewed with the patient. Potential for esophageal dilation, biopsy, etc. have also been reviewed.  Questions have been answered. All parties agreeable.  Continue on dicyclomine twice daily.  Refilled in office today.  Continue on as needed Zofran for nausea and vomiting, refilled in office today.  Further recommendations to follow.  Thank you Dr. Carlena Sax for the kind referral. 06/03/2020 8:42 AM   Disclaimer: This note was dictated with voice recognition software. Similar sounding words can inadvertently be transcribed and may not be corrected upon review.

## 2020-06-04 ENCOUNTER — Other Ambulatory Visit (HOSPITAL_COMMUNITY)
Admission: RE | Admit: 2020-06-04 | Discharge: 2020-06-04 | Disposition: A | Discharge status: Home / Self Care | Payer: HRSA Program | Source: Ambulatory Visit | Attending: Internal Medicine | Admitting: Internal Medicine

## 2020-06-04 ENCOUNTER — Other Ambulatory Visit: Payer: Self-pay

## 2020-06-04 DIAGNOSIS — Z01812 Encounter for preprocedural laboratory examination: Secondary | ICD-10-CM | POA: Diagnosis present

## 2020-06-04 DIAGNOSIS — Z20822 Contact with and (suspected) exposure to covid-19: Secondary | ICD-10-CM | POA: Insufficient documentation

## 2020-06-04 LAB — SARS CORONAVIRUS 2 (TAT 6-24 HRS): SARS Coronavirus 2: NEGATIVE

## 2020-06-07 ENCOUNTER — Ambulatory Visit (HOSPITAL_COMMUNITY): Payer: Self-pay | Admitting: Anesthesiology

## 2020-06-07 ENCOUNTER — Encounter (HOSPITAL_COMMUNITY)
Admission: RE | Disposition: A | Discharge status: Home / Self Care | Payer: Self-pay | Source: Home / Self Care | Attending: Internal Medicine

## 2020-06-07 ENCOUNTER — Ambulatory Visit (HOSPITAL_COMMUNITY)
Admission: RE | Admit: 2020-06-07 | Discharge: 2020-06-07 | Disposition: A | Discharge status: Home / Self Care | Payer: Self-pay | Attending: Internal Medicine | Admitting: Internal Medicine

## 2020-06-07 ENCOUNTER — Other Ambulatory Visit: Payer: Self-pay

## 2020-06-07 ENCOUNTER — Encounter (HOSPITAL_COMMUNITY): Payer: Self-pay

## 2020-06-07 DIAGNOSIS — R112 Nausea with vomiting, unspecified: Secondary | ICD-10-CM | POA: Insufficient documentation

## 2020-06-07 DIAGNOSIS — K5909 Other constipation: Secondary | ICD-10-CM | POA: Insufficient documentation

## 2020-06-07 DIAGNOSIS — R634 Abnormal weight loss: Secondary | ICD-10-CM | POA: Insufficient documentation

## 2020-06-07 DIAGNOSIS — Z87891 Personal history of nicotine dependence: Secondary | ICD-10-CM | POA: Insufficient documentation

## 2020-06-07 DIAGNOSIS — K2270 Barrett's esophagus without dysplasia: Secondary | ICD-10-CM | POA: Insufficient documentation

## 2020-06-07 DIAGNOSIS — K5900 Constipation, unspecified: Secondary | ICD-10-CM

## 2020-06-07 DIAGNOSIS — R1032 Left lower quadrant pain: Secondary | ICD-10-CM | POA: Insufficient documentation

## 2020-06-07 DIAGNOSIS — R1011 Right upper quadrant pain: Secondary | ICD-10-CM | POA: Insufficient documentation

## 2020-06-07 DIAGNOSIS — K648 Other hemorrhoids: Secondary | ICD-10-CM | POA: Insufficient documentation

## 2020-06-07 DIAGNOSIS — K2970 Gastritis, unspecified, without bleeding: Secondary | ICD-10-CM | POA: Insufficient documentation

## 2020-06-07 DIAGNOSIS — K259 Gastric ulcer, unspecified as acute or chronic, without hemorrhage or perforation: Secondary | ICD-10-CM | POA: Insufficient documentation

## 2020-06-07 DIAGNOSIS — K5730 Diverticulosis of large intestine without perforation or abscess without bleeding: Secondary | ICD-10-CM | POA: Insufficient documentation

## 2020-06-07 DIAGNOSIS — K2289 Other specified disease of esophagus: Secondary | ICD-10-CM

## 2020-06-07 HISTORY — PX: BIOPSY: SHX5522

## 2020-06-07 HISTORY — PX: COLONOSCOPY WITH PROPOFOL: SHX5780

## 2020-06-07 HISTORY — PX: ESOPHAGOGASTRODUODENOSCOPY (EGD) WITH PROPOFOL: SHX5813

## 2020-06-07 SURGERY — COLONOSCOPY WITH PROPOFOL
Anesthesia: General

## 2020-06-07 MED ORDER — LACTATED RINGERS IV SOLN: INTRAVENOUS | Status: DC | PRN

## 2020-06-07 MED ORDER — PROPOFOL 500 MG/50ML IV EMUL
INTRAVENOUS | Status: DC | PRN
  Administered 2020-06-07: 150 ug/kg/min via INTRAVENOUS

## 2020-06-07 MED ORDER — GLYCOPYRROLATE 0.2 MG/ML IJ SOLN
INTRAMUSCULAR | Status: AC
  Filled 2020-06-07: qty 1

## 2020-06-07 MED ORDER — PROPOFOL 10 MG/ML IV BOLUS
INTRAVENOUS | Status: AC
  Filled 2020-06-07: qty 120

## 2020-06-07 MED ORDER — LACTATED RINGERS IV SOLN: Freq: Once | INTRAVENOUS | Status: AC

## 2020-06-07 MED ORDER — LIDOCAINE VISCOUS HCL 2 % MT SOLN
OROMUCOSAL | Status: AC
  Filled 2020-06-07: qty 15

## 2020-06-07 MED ORDER — GLYCOPYRROLATE 0.2 MG/ML IJ SOLN
0.2000 mg | Freq: Once | INTRAMUSCULAR | Status: AC
  Administered 2020-06-07: 0.2 mg via INTRAVENOUS

## 2020-06-07 MED ORDER — OMEPRAZOLE 40 MG PO CPDR: 40.0000 mg | DELAYED_RELEASE_CAPSULE | Freq: Every day | ORAL | 5 refills | Status: DC

## 2020-06-07 MED ORDER — LIDOCAINE VISCOUS HCL 2 % MT SOLN
15.0000 mL | Freq: Once | OROMUCOSAL | Status: AC
  Administered 2020-06-07: 15 mL via OROMUCOSAL

## 2020-06-07 MED ORDER — STERILE WATER FOR IRRIGATION IR SOLN
Status: DC | PRN
  Administered 2020-06-07: 100 mL

## 2020-06-07 MED ORDER — PROPOFOL 10 MG/ML IV BOLUS
INTRAVENOUS | Status: DC | PRN
  Administered 2020-06-07 (×5): 50 mg via INTRAVENOUS

## 2020-06-07 NOTE — Interval H&P Note (Signed)
History and Physical Interval Note:  06/07/2020 8:04 AM  Todd Reilly  has presented today for surgery, with the diagnosis of nausea, vomiting, constipation, LLQ pain.  The various methods of treatment have been discussed with the patient and family. After consideration of risks, benefits and other options for treatment, the patient has consented to  Procedure(s) with comments: COLONOSCOPY WITH PROPOFOL (N/A) - 10:00am ESOPHAGOGASTRODUODENOSCOPY (EGD) WITH PROPOFOL (N/A) as a surgical intervention.  The patient's history has been reviewed, patient examined, no change in status, stable for surgery.  I have reviewed the patient's chart and labs.  Questions were answered to the patient's satisfaction.     Lanelle Bal

## 2020-06-07 NOTE — Op Note (Signed)
[facility] Patient Name: Todd Reilly Procedure Date: 06/07/2020 8:33 AM MRN: [id number] Date of Birth: [birth year] Attending MD: Elon Alas. Abbey Chatters DO CSN: [id number] Age: 54 Admit Type: Outpatient Procedure:                Upper GI endoscopy Indications:              Abdominal pain in the right upper quadrant, Nausea                            with vomiting Providers:                Elon Alas. Abbey Chatters, DO, Crystal Page, Randa Spike, Technician Referring MD:              Medicines:                See the Anesthesia note for documentation of the                            administered medications Complications:            No immediate complications. Estimated Blood Loss:     Estimated blood loss was minimal. Procedure:                Pre-Anesthesia Assessment:                           - The anesthesia plan was to use monitored                            anesthesia care (MAC).                           After obtaining informed consent, the endoscope was                            passed under direct vision. Throughout the                            procedure, the patient's blood pressure, pulse, and                            oxygen saturations were monitored continuously. The                            Endoscope was introduced through the mouth, and                            advanced to the second part of duodenum. The upper                            GI endoscopy was accomplished without difficulty.                            The patient tolerated the procedure well.  Scope In: 8:43:01 AM Scope Out: 8:47:22 AM Total Procedure Duration: 0 hours 4 minutes 21 seconds  Findings:      The Z-line was irregular. Biopsies were taken with a cold forceps for       histology.      Biopsies were taken with a cold forceps in the middle third of the       esophagus for histology.      Localized moderate inflammation characterized by erosions, erythema and        shallow ulcerations was found in the gastric antrum. Biopsies were taken       with a cold forceps for Helicobacter pylori testing.      The duodenal bulb, first portion of the duodenum and second portion of       the duodenum were normal. Impression:               - Z-line irregular. Biopsied.                           - Gastritis. Biopsied.                           - Normal duodenal bulb, first portion of the                            duodenum and second portion of the duodenum.                           - Biopsies were taken with a cold forceps for                            histology in the middle third of the esophagus. Moderate Sedation:      Per Anesthesia Care Recommendation:           - Patient has a contact number available for                            emergencies. The signs and symptoms of potential                            delayed complications were discussed with the                            patient. Return to normal activities tomorrow.                            Written discharge instructions were provided to the                            patient.                           - Resume previous diet.                           - Continue present medications.                           -  Await pathology results.                           - Use Prilosec (omeprazole) 40 mg PO daily.                           - Return to GI clinic in 3 months. Procedure Code(s):        --- Professional ---                           [id number], Esophagogastroduodenoscopy, flexible,                            transoral; with biopsy, single or multiple Diagnosis Code(s):        --- Professional ---                           K22.8, Other specified diseases of esophagus                           K29.70, Gastritis, unspecified, without bleeding                           R10.11, Right upper quadrant pain                           R11.2, Nausea with vomiting, unspecified CPT copyright 2019 [employer]  [employer] Association. All rights reserved. The codes documented in this report are preliminary and upon coder review may  be revised to meet current compliance requirements. Elon Alas. Abbey Chatters, DO [place] Abbey Chatters, DO 06/07/2020 8:49:26 AM This report has been signed electronically. Number of Addenda: 0

## 2020-06-07 NOTE — Discharge Instructions (Addendum)
EGD Discharge instructions Please read the instructions outlined below and refer to this sheet in the next few weeks. These discharge instructions provide you with general information on caring for yourself after you leave the hospital. Your doctor may also give you specific instructions. While your treatment has been planned according to the most current medical practices available, unavoidable complications occasionally occur. If you have any problems or questions after discharge, please call your doctor. ACTIVITY  You may resume your regular activity but move at a slower pace for the next 24 hours.   Take frequent rest periods for the next 24 hours.   Walking will help expel (get rid of) the air and reduce the bloated feeling in your abdomen.   No driving for 24 hours (because of the anesthesia (medicine) used during the test).   You may shower.   Do not sign any important legal documents or operate any machinery for 24 hours (because of the anesthesia used during the test).  NUTRITION  Drink plenty of fluids.   You may resume your normal diet.   Begin with a light meal and progress to your normal diet.   Avoid alcoholic beverages for 24 hours or as instructed by your caregiver.  MEDICATIONS  You may resume your normal medications unless your caregiver tells you otherwise.  WHAT YOU CAN EXPECT TODAY  You may experience abdominal discomfort such as a feeling of fullness or "gas" pains.  FOLLOW-UP  Your doctor will discuss the results of your test with you.  SEEK IMMEDIATE MEDICAL ATTENTION IF ANY OF THE FOLLOWING OCCUR:  Excessive nausea (feeling sick to your stomach) and/or vomiting.   Severe abdominal pain and distention (swelling).   Trouble swallowing.   Temperature over 101 F (37.8 C).   Rectal bleeding or vomiting of blood.     Colonoscopy Discharge Instructions  Read the instructions outlined below and refer to this sheet in the next few weeks. These  discharge instructions provide you with general information on caring for yourself after you leave the hospital. Your doctor may also give you specific instructions. While your treatment has been planned according to the most current medical practices available, unavoidable complications occasionally occur.   ACTIVITY  You may resume your regular activity, but move at a slower pace for the next 24 hours.   Take frequent rest periods for the next 24 hours.   Walking will help get rid of the air and reduce the bloated feeling in your belly (abdomen).   No driving for 24 hours (because of the medicine (anesthesia) used during the test).    Do not sign any important legal documents or operate any machinery for 24 hours (because of the anesthesia used during the test).  NUTRITION  Drink plenty of fluids.   You may resume your normal diet as instructed by your doctor.   Begin with a light meal and progress to your normal diet. Heavy or fried foods are harder to digest and may make you feel sick to your stomach (nauseated).   Avoid alcoholic beverages for 24 hours or as instructed.  MEDICATIONS  You may resume your normal medications unless your doctor tells you otherwise.  WHAT YOU CAN EXPECT TODAY  Some feelings of bloating in the abdomen.   Passage of more gas than usual.   Spotting of blood in your stool or on the toilet paper.  IF YOU HAD POLYPS REMOVED DURING THE COLONOSCOPY:  No aspirin products for 7 days or as instructed.  No alcohol for 7 days or as instructed.   Eat a soft diet for the next 24 hours.  FINDING OUT THE RESULTS OF YOUR TEST Not all test results are available during your visit. If your test results are not back during the visit, make an appointment with your caregiver to find out the results. Do not assume everything is normal if you have not heard from your caregiver or the medical facility. It is important for you to follow up on all of your test results.    SEEK IMMEDIATE MEDICAL ATTENTION IF:  You have more than a spotting of blood in your stool.   Your belly is swollen (abdominal distention).   You are nauseated or vomiting.   You have a temperature over 101.   You have abdominal pain or discomfort that is severe or gets worse throughout the day.   Your EGD showed inflammation and ulcers in your stomach.  I biopsied this to rule infection with bacteria called H. pylori.  Also have evidence of reflux changes in your esophagus which I biopsied as well.  I will to start you on omeprazole 40 mg daily.  Take this 30 minutes before breakfast.  Await pathology results, my office will contact you.  Avoid NSAIDs.  Your colonoscopy was relatively unremarkable besides internal hemorrhoids and diverticulosis.  I would continue on fiber supplementation with Metamucil or Benefiber.  Be sure to drink at least 4 to 6 glasses of water daily.  I would also start MiraLAX 1 capful daily and titrate up as needed.  Follow-up with GI in 3 months or sooner if needed  I hope you have a great rest of your week!  Hennie Duos. Marletta Lor, D.O. Gastroenterology and Hepatology [facility]    Hemorrhoids Hemorrhoids are swollen veins that may develop:  In the butt (rectum). These are called internal hemorrhoids.  Around the opening of the butt (anus). These are called external hemorrhoids. Hemorrhoids can cause pain, itching, or bleeding. Most of the time, they do not cause serious problems. They usually get better with diet changes, lifestyle changes, and other home treatments. What are the causes? This condition may be caused by:  Having trouble pooping (constipation).  Pushing hard (straining) to poop.  Watery poop (diarrhea).  Pregnancy.  Being very overweight (obese).  Sitting for long periods of time.  Heavy lifting or other activity that causes you to strain.  Anal sex.  Riding a bike for a long period of time. What  are the signs or symptoms? Symptoms of this condition include:  Pain.  Itching or soreness in the butt.  Bleeding from the butt.  Leaking poop.  Swelling in the area.  One or more lumps around the opening of your butt. How is this diagnosed? A doctor can often diagnose this condition by looking at the affected area. The doctor may also:  Do an exam that involves feeling the area with a gloved hand (digital rectal exam).  Examine the area inside your butt using a small tube (anoscope).  Order blood tests. This may be done if you have lost a lot of blood.  Have you get a test that involves looking inside the colon using a flexible tube with a camera on the end (sigmoidoscopy or colonoscopy). How is this treated? This condition can usually be treated at home. Your doctor may tell you to change what you eat, make lifestyle changes, or try home treatments. If these do not help, procedures can be done  to remove the hemorrhoids or make them smaller. These may involve:  Placing rubber bands at the base of the hemorrhoids to cut off their blood supply.  Injecting medicine into the hemorrhoids to shrink them.  Shining a type of light energy onto the hemorrhoids to cause them to fall off.  Doing surgery to remove the hemorrhoids or cut off their blood supply. Follow these instructions at home: Eating and drinking   Eat foods that have a lot of fiber in them. These include whole grains, beans, nuts, fruits, and vegetables.  Ask your doctor about taking products that have added fiber (fibersupplements).  Reduce the amount of fat in your diet. You can do this by: ? Eating low-fat dairy products. ? Eating less red meat. ? Avoiding processed foods.  Drink enough fluid to keep your pee (urine) pale yellow. Managing pain and swelling   Take a warm-water bath (sitz bath) for 20 minutes to ease pain. Do this 3-4 times a day. You may do this in a bathtub or using a portable sitz bath  that fits over the toilet.  If told, put ice on the painful area. It may be helpful to use ice between your warm baths. ? Put ice in a plastic bag. ? Place a towel between your skin and the bag. ? Leave the ice on for 20 minutes, 2-3 times a day. General instructions  Take over-the-counter and prescription medicines only as told by your doctor. ? Medicated creams and medicines may be used as told.  Exercise often. Ask your doctor how much and what kind of exercise is best for you.  Go to the bathroom when you have the urge to poop. Do not wait.  Avoid pushing too hard when you poop.  Keep your butt dry and clean. Use wet toilet paper or moist towelettes after pooping.  Do not sit on the toilet for a long time.  Keep all follow-up visits as told by your doctor. This is important. Contact a doctor if you:  Have pain and swelling that do not get better with treatment or medicine.  Have trouble pooping.  Cannot poop.  Have pain or swelling outside the area of the hemorrhoids. Get help right away if you have:  Bleeding that will not stop. Summary  Hemorrhoids are swollen veins in the butt or around the opening of the butt.  They can cause pain, itching, or bleeding.  Eat foods that have a lot of fiber in them. These include whole grains, beans, nuts, fruits, and vegetables.  Take a warm-water bath (sitz bath) for 20 minutes to ease pain. Do this 3-4 times a day. This information is not intended to replace advice given to you by your health care provider. Make sure you discuss any questions you have with your health care provider. Document Revised: 07/25/2018 Document Reviewed: 12/06/2017 Elsevier Patient Education  2020 [employer].  Diverticulosis  Diverticulosis is a condition that develops when small pouches (diverticula) form in the wall of the large intestine (colon). The colon is where water is absorbed and stool (feces) is formed. The pouches form when the inside  layer of the colon pushes through weak spots in the outer layers of the colon. You may have a few pouches or many of them. The pouches usually do not cause problems unless they become inflamed or infected. When this happens, the condition is called diverticulitis. What are the causes? The cause of this condition is not known. What increases the risk? The  following factors may make you more likely to develop this condition:  Being older than age 73. Your risk for this condition increases with age. Diverticulosis is rare among people younger than age 52. By age 63, many people have it.  Eating a low-fiber diet.  Having frequent constipation.  Being overweight.  Not getting enough exercise.  Smoking.  Taking over-the-counter pain medicines, like aspirin and ibuprofen.  Having a family history of diverticulosis. What are the signs or symptoms? In most people, there are no symptoms of this condition. If you do have symptoms, they may include:  Bloating.  Cramps in the abdomen.  Constipation or diarrhea.  Pain in the lower left side of the abdomen. How is this diagnosed? Because diverticulosis usually has no symptoms, it is most often diagnosed during an exam for other colon problems. The condition may be diagnosed by:  Using a flexible scope to examine the colon (colonoscopy).  Taking an X-ray of the colon after dye has been put into the colon (barium enema).  Having a CT scan. How is this treated? You may not need treatment for this condition. Your health care provider may recommend treatment to prevent problems. You may need treatment if you have symptoms or if you previously had diverticulitis. Treatment may include:  Eating a high-fiber diet.  Taking a fiber supplement.  Taking a live bacteria supplement (probiotic).  Taking medicine to relax your colon. Follow these instructions at home: Medicines  Take over-the-counter and prescription medicines only as told by  your health care provider.  If told by your health care provider, take a fiber supplement or probiotic. Constipation prevention Your condition may cause constipation. To prevent or treat constipation, you may need to:  Drink enough fluid to keep your urine pale yellow.  Take over-the-counter or prescription medicines.  Eat foods that are high in fiber, such as beans, whole grains, and fresh fruits and vegetables.  Limit foods that are high in fat and processed sugars, such as fried or sweet foods.  General instructions  Try not to strain when you have a bowel movement.  Keep all follow-up visits as told by your health care provider. This is important. Contact a health care provider if you:  Have pain in your abdomen.  Have bloating.  Have cramps.  Have not had a bowel movement in 3 days. Get help right away if:  Your pain gets worse.  Your bloating becomes very bad.  You have a fever or chills, and your symptoms suddenly get worse.  You vomit.  You have bowel movements that are bloody or black.  You have bleeding from your rectum. Summary  Diverticulosis is a condition that develops when small pouches (diverticula) form in the wall of the large intestine (colon).  You may have a few pouches or many of them.  This condition is most often diagnosed during an exam for other colon problems.  Treatment may include increasing the fiber in your diet, taking supplements, or taking medicines. This information is not intended to replace advice given to you by your health care provider. Make sure you discuss any questions you have with your health care provider. Document Revised: 02/13/2019 Document Reviewed: 02/13/2019 Elsevier Patient Education  2020 [employer] Inc[employer]  Peptic Ulcer  A peptic ulcer is a painful sore in the lining of your stomach or the first part of your small intestine. What are the causes? Common causes of this condition include:  An  infection.  Using certain pain medicines  too often or too much. What increases the risk? You are more likely to get this condition if you:  Smoke.  Have a family history of ulcer disease.  Drink alcohol.  Have been hospitalized in an intensive care unit (ICU). What are the signs or symptoms? Symptoms include:  Burning pain in the area between the chest and the belly button. The pain may: ? Not go away (be persistent). ? Be worse when your stomach is empty. ? Be worse at night.  Heartburn.  Feeling sick to your stomach (nauseous) and throwing up (vomiting).  Bloating. If the ulcer results in bleeding, it can cause you to:  Have poop (stool) that is black and looks like tar.  Throw up bright red blood.  Throw up material that looks like coffee grounds. How is this treated? Treatment for this condition may include:  Stopping things that can cause the ulcer, such as: ? Smoking. ? Using pain medicines.  Medicines to reduce stomach acid.  Antibiotic medicines if the ulcer is caused by an infection.  A procedure that is done using a small, flexible tube that has a camera at the end (upper endoscopy). This may be done if you have a bleeding ulcer.  Surgery. This may be needed if: ? You have a lot of bleeding. ? The ulcer caused a hole somewhere in the digestive system. Follow these instructions at home:  Do not drink alcohol if your doctor tells you not to drink.  Limit how much caffeine you take in.  Do not use any products that contain nicotine or tobacco, such as cigarettes, e-cigarettes, and chewing tobacco. If you need help quitting, ask your doctor.  Take over-the-counter and prescription medicines only as told by your doctor. ? Do not stop or change your medicines unless you talk with your doctor about it first. ? Do not take aspirin, ibuprofen, or other NSAIDs unless your doctor told you to do so.  Keep all follow-up visits as told by your doctor. This is  important. Contact a doctor if:  You do not get better in 7 days after you start treatment.  You keep having an upset stomach (indigestion) or heartburn. Get help right away if:  You have sudden, sharp pain in your belly (abdomen).  You have belly pain that does not go away.  You have bloody poop (stool) or black, tarry poop.  You throw up blood. It may look like coffee grounds.  You feel light-headed or feel like you may pass out (faint).  You get weak.  You get sweaty or feel sticky and cold to the touch (clammy). Summary  Symptoms of a peptic ulcer include burning pain in the area between the chest and the belly button.  Take medicines only as told by your doctor.  Limit how much alcohol and caffeine you have.  Keep all follow-up visits as told by your doctor. This information is not intended to replace advice given to you by your health care provider. Make sure you discuss any questions you have with your health care provider. Document Revised: 01/22/2018 Document Reviewed: 01/22/2018 Elsevier Patient Education  2020 [employer]

## 2020-06-07 NOTE — Anesthesia Postprocedure Evaluation (Signed)
Anesthesia Post Note  Patient: Todd Reilly  Procedure(s) Performed: COLONOSCOPY WITH PROPOFOL (N/A ) ESOPHAGOGASTRODUODENOSCOPY (EGD) WITH PROPOFOL (N/A ) BIOPSY  Patient location during evaluation: Endoscopy Anesthesia Type: General Level of consciousness: awake and alert and oriented Pain management: pain level controlled Vital Signs Assessment: post-procedure vital signs reviewed and stable Respiratory status: spontaneous breathing Cardiovascular status: blood pressure returned to baseline and stable Postop Assessment: no apparent nausea or vomiting Anesthetic complications: no   No complications documented.   Last Vitals:  Vitals:   06/07/20 0715  BP: (!) 142/102  Pulse: 85  Resp: 12  Temp: 36.7 C  SpO2: 99%    Last Pain:  Vitals:   06/07/20 0836  TempSrc:   PainSc: 0-No pain                 Rahsaan Weakland

## 2020-06-07 NOTE — Anesthesia Preprocedure Evaluation (Addendum)
Anesthesia Evaluation  Patient identified by MRN, date of birth, ID band Patient awake    Reviewed: Allergy & Precautions, NPO status , Patient's Chart, lab work & pertinent test results  History of Anesthesia Complications Negative for: history of anesthetic complications  Airway Mallampati: II  TM Distance: >3 FB Neck ROM: Full    Dental  (+) Dental Advisory Given, Chipped Left lower broken tooth :   Pulmonary former smoker,    Pulmonary exam normal breath sounds clear to auscultation       Cardiovascular Exercise Tolerance: Good Normal cardiovascular exam Rhythm:Regular Rate:Normal     Neuro/Psych PSYCHIATRIC DISORDERS Anxiety    GI/Hepatic negative GI ROS,   Endo/Other  negative endocrine ROS  Renal/GU negative Renal ROS     Musculoskeletal negative musculoskeletal ROS (+)   Abdominal   Peds negative pediatric ROS (+)  Hematology negative hematology ROS (+)   Anesthesia Other Findings   Reproductive/Obstetrics negative OB ROS                            Anesthesia Physical Anesthesia Plan  ASA: II  Anesthesia Plan: General   Post-op Pain Management:    Induction: Intravenous  PONV Risk Score and Plan: TIVA  Airway Management Planned: Nasal Cannula and Natural Airway  Additional Equipment:   Intra-op Plan:   Post-operative Plan:   Informed Consent: I have reviewed the patients History and Physical, chart, labs and discussed the procedure including the risks, benefits and alternatives for the proposed anesthesia with the patient or authorized representative who has indicated his/her understanding and acceptance.     Dental advisory given  Plan Discussed with: CRNA and Surgeon  Anesthesia Plan Comments:         Anesthesia Quick Evaluation

## 2020-06-07 NOTE — Op Note (Signed)
[facility] Patient Name: Todd Reilly Procedure Date: 06/07/2020 8:49 AM MRN: [id number] Date of Birth: [birth year] Attending MD: Elon Alas. Abbey Chatters DO CSN: [id number] Age: 54 Admit Type: Outpatient Procedure:                Colonoscopy Indications:              Abdominal pain in the left lower quadrant,                            Constipation Providers:                Elon Alas. Abbey Chatters, DO, Crystal Page, Randa Spike, Technician Referring MD:              Medicines:                See the Anesthesia note for documentation of the                            administered medications Complications:            No immediate complications. Estimated Blood Loss:     Estimated blood loss: none. Procedure:                Pre-Anesthesia Assessment:                           - The anesthesia plan was to use monitored                            anesthesia care (MAC).                           After obtaining informed consent, the colonoscope                            was passed under direct vision. Throughout the                            procedure, the patient's blood pressure, pulse, and                            oxygen saturations were monitored continuously. The                            [id number]-HQ190L ([id number]) scope was introduced through                            the anus and advanced to the the terminal ileum,                            with identification of the appendiceal orifice and                            IC valve. The colonoscopy was performed without  difficulty. The patient tolerated the procedure                            well. The quality of the bowel preparation was                            evaluated using the BBPS [facility] Bowel Preparation                            Scale) with scores of: Right Colon = 2 (minor                            amount of residual staining, small fragments of                             stool and/or opaque liquid, but mucosa seen well),                            Transverse Colon = 2 (minor amount of residual                            staining, small fragments of stool and/or opaque                            liquid, but mucosa seen well) and Left Colon = 2                            (minor amount of residual staining, small fragments                            of stool and/or opaque liquid, but mucosa seen                            well). The total BBPS score equals 6. The quality                            of the bowel preparation was fair. Scope In: 8:50:48 AM Scope Out: 9:01:58 AM Scope Withdrawal Time: 0 hours 8 minutes 56 seconds  Total Procedure Duration: 0 hours 11 minutes 10 seconds  Findings:      The perianal and digital rectal examinations were normal.      Non-bleeding internal hemorrhoids were found during endoscopy.      Many small and large-mouthed diverticula were found in the sigmoid colon       and descending colon.      The terminal ileum appeared normal.      The exam was otherwise without abnormality. Impression:               - Preparation of the colon was fair.                           - Non-bleeding internal hemorrhoids.                           -  Diverticulosis in the sigmoid colon and in the                            descending colon.                           - The examined portion of the ileum was normal.                           - The examination was otherwise normal.                           - No specimens collected. Moderate Sedation:      Per Anesthesia Care Recommendation:           - Patient has a contact number available for                            emergencies. The signs and symptoms of potential                            delayed complications were discussed with the                            patient. Return to normal activities tomorrow.                            Written discharge instructions were provided to the                             patient.                           - Resume previous diet.                           - Continue present medications.                           - Repeat colonoscopy in 10 years for screening                            purposes.                           - Return to GI clinic in 3 months. Procedure Code(s):        --- Professional ---                           [phone], Colonoscopy, flexible; diagnostic, including                            collection of specimen(s) by brushing or washing,                            when performed (separate procedure) Diagnosis Code(s):        ---  Professional ---                           K64.8, Other hemorrhoids                           R10.32, Left lower quadrant pain                           K59.00, Constipation, unspecified                           K57.30, Diverticulosis of large intestine without                            perforation or abscess without bleeding CPT copyright 2019 [employer] Association. All rights reserved. The codes documented in this report are preliminary and upon coder review may  be revised to meet current compliance requirements. Elon Alas. Abbey Chatters, DO [place] Abbey Chatters, DO 06/07/2020 9:05:26 AM This report has been signed electronically. Number of Addenda: 0

## 2020-06-07 NOTE — Transfer of Care (Signed)
Immediate Anesthesia Transfer of Care Note  Patient: Todd Reilly  Procedure(s) Performed: COLONOSCOPY WITH PROPOFOL (N/A ) ESOPHAGOGASTRODUODENOSCOPY (EGD) WITH PROPOFOL (N/A ) BIOPSY  Patient Location: Endoscopy Unit  Anesthesia Type:General  Level of Consciousness: awake  Airway & Oxygen Therapy: Patient Spontanous Breathing  Post-op Assessment: Report given to RN  Post vital signs: Reviewed  Last Vitals:  Vitals Value Taken Time  BP    Temp    Pulse    Resp    SpO2      Last Pain:  Vitals:   06/07/20 0836  TempSrc:   PainSc: 0-No pain      Patients Stated Pain Goal: 8 (06/07/20 0715)  Complications: No complications documented.

## 2020-06-08 ENCOUNTER — Other Ambulatory Visit: Payer: Self-pay

## 2020-06-08 ENCOUNTER — Telehealth: Payer: Self-pay | Admitting: Internal Medicine

## 2020-06-08 LAB — SURGICAL PATHOLOGY

## 2020-06-08 NOTE — Telephone Encounter (Signed)
Noted, will attempt tomorrow

## 2020-06-08 NOTE — Telephone Encounter (Signed)
Called the pt back advising Dr Marletta Lor has not released his results yet from his colonoscopy on yesterday once he does I will call him. He also wanted to let Dr Marletta Lor know he's had 3 BM's today and they all were dark and tarry. Pt wants to know is it normal after a procedure. Will route to Dr Marletta Lor for instructions.

## 2020-06-08 NOTE — Telephone Encounter (Signed)
Pt was calling on line 6 (doctor's line) asking questions about his colonoscopy he had yesterday by Dr Marletta Lor. I told him that I would have to have the nurse call him back because he was on the doctor's line. Please call him at [phone]

## 2020-06-08 NOTE — Telephone Encounter (Signed)
I attempted to call patient but he did not answer the phone, voicemail was full.  His biopsy showed inflammation in his stomach, negative for H. pylori.  He does have evidence of Barrett's esophagus and needs to continue on PPI therapy.  His procedures were noninvasive with only biopsies being performed.  Bleeding risk is certainly low.  That being said, given his melena, if this does not improve then we will need to check CBC tomorrow.  If he has evidence of chest pain, dyspnea, severe abdominal pain, weakness/fatigue, etc. he should present to the emergency room.  Thank you

## 2020-06-11 ENCOUNTER — Encounter (HOSPITAL_COMMUNITY): Payer: Self-pay | Admitting: Internal Medicine

## 2020-06-15 NOTE — Telephone Encounter (Signed)
Phoned and spoke with the pt advised of the results and drs instructions. Pt states he feels a lot better.

## 2020-07-06 ENCOUNTER — Encounter: Payer: Self-pay | Admitting: Internal Medicine

## 2020-09-01 ENCOUNTER — Encounter: Payer: Self-pay | Admitting: Internal Medicine

## 2020-09-01 ENCOUNTER — Ambulatory Visit: Payer: Self-pay | Admitting: Internal Medicine

## 2021-09-20 ENCOUNTER — Emergency Department (HOSPITAL_COMMUNITY)
Admission: EM | Admit: 2021-09-20 | Discharge: 2021-09-20 | Disposition: A | Discharge status: Home / Self Care | Payer: Self-pay | Attending: Emergency Medicine | Admitting: Emergency Medicine

## 2021-09-20 ENCOUNTER — Other Ambulatory Visit: Payer: Self-pay

## 2021-09-20 ENCOUNTER — Encounter (HOSPITAL_COMMUNITY): Payer: Self-pay

## 2021-09-20 DIAGNOSIS — K644 Residual hemorrhoidal skin tags: Secondary | ICD-10-CM | POA: Insufficient documentation

## 2021-09-20 DIAGNOSIS — R112 Nausea with vomiting, unspecified: Secondary | ICD-10-CM | POA: Insufficient documentation

## 2021-09-20 DIAGNOSIS — K6289 Other specified diseases of anus and rectum: Secondary | ICD-10-CM | POA: Insufficient documentation

## 2021-09-20 LAB — CBC WITH DIFFERENTIAL/PLATELET
Abs Immature Granulocytes: 0.03 10*3/uL (ref 0.00–0.07)
Basophils Absolute: 0 10*3/uL (ref 0.0–0.1)
Basophils Relative: 0 %
Eosinophils Absolute: 0.1 10*3/uL (ref 0.0–0.5)
Eosinophils Relative: 1 %
HCT: 43 % (ref 39.0–52.0)
Hemoglobin: 14.7 g/dL (ref 13.0–17.0)
Immature Granulocytes: 0 %
Lymphocytes Relative: 9 %
Lymphs Abs: 1.1 10*3/uL (ref 0.7–4.0)
MCH: 31.3 pg (ref 26.0–34.0)
MCHC: 34.2 g/dL (ref 30.0–36.0)
MCV: 91.5 fL (ref 80.0–100.0)
Monocytes Absolute: 0.8 10*3/uL (ref 0.1–1.0)
Monocytes Relative: 7 %
Neutro Abs: 9.4 10*3/uL — ABNORMAL HIGH (ref 1.7–7.7)
Neutrophils Relative %: 83 %
Platelets: 143 10*3/uL — ABNORMAL LOW (ref 150–400)
RBC: 4.7 MIL/uL (ref 4.22–5.81)
RDW: 12.4 % (ref 11.5–15.5)
WBC: 11.4 10*3/uL — ABNORMAL HIGH (ref 4.0–10.5)
nRBC: 0 % (ref 0.0–0.2)

## 2021-09-20 LAB — COMPREHENSIVE METABOLIC PANEL
ALT: 16 U/L (ref 0–44)
AST: 16 U/L (ref 15–41)
Albumin: 3.8 g/dL (ref 3.5–5.0)
Alkaline Phosphatase: 50 U/L (ref 38–126)
Anion gap: 6 (ref 5–15)
BUN: 14 mg/dL (ref 6–20)
CO2: 29 mmol/L (ref 22–32)
Calcium: 9.1 mg/dL (ref 8.9–10.3)
Chloride: 102 mmol/L (ref 98–111)
Creatinine, Ser: 0.89 mg/dL (ref 0.61–1.24)
GFR, Estimated: >60 mL/min (ref >60)
Glucose, Bld: 114 mg/dL — ABNORMAL HIGH (ref 70–99)
Potassium: 4.7 mmol/L (ref 3.5–5.1)
Sodium: 137 mmol/L (ref 135–145)
Total Bilirubin: 1 mg/dL (ref 0.3–1.2)
Total Protein: 7.2 g/dL (ref 6.5–8.1)

## 2021-09-20 LAB — POC OCCULT BLOOD, ED: Fecal Occult Bld: NEGATIVE

## 2021-09-20 LAB — LIPASE, BLOOD: Lipase: 28 U/L (ref 11–51)

## 2021-09-20 MED ORDER — HYDROCORTISONE (PERIANAL) 2.5 % EX CREA: 1.0000 "application " | TOPICAL_CREAM | Freq: Two times a day (BID) | CUTANEOUS | 0 refills | Status: DC

## 2021-09-20 MED ORDER — POLYETHYLENE GLYCOL 3350 17 GM/SCOOP PO POWD: 1.0000 | Freq: Once | ORAL | 0 refills | Status: AC

## 2021-09-20 MED ORDER — DOCUSATE SODIUM 250 MG PO CAPS: 250.0000 mg | ORAL_CAPSULE | Freq: Every day | ORAL | 0 refills | Status: DC

## 2021-09-20 NOTE — ED Provider Notes (Signed)
Patient signed out to me by previous provider. Please refer to their note for full HPI.  Briefly this is a 56 year old male who presents the emergency department with rectal pain.  Symptoms are felt to be secondary to hemorrhoids/constipation.  We are pending lab evaluation.  Anticipate discharge if reassuring. Physical Exam  BP 126/76    Pulse 80    Temp 98.2 F (36.8 C) (Oral)    Resp 16    Ht 5\' 10"  (1.778 m)    Wt 79.4 kg    SpO2 100%    BMI 25.11 kg/m   Physical Exam Vitals and nursing note reviewed.  Constitutional:      Appearance: Normal appearance.  HENT:     Head: Normocephalic.     Mouth/Throat:     Mouth: Mucous membranes are moist.  Cardiovascular:     Rate and Rhythm: Normal rate.  Pulmonary:     Effort: Pulmonary effort is normal. No respiratory distress.  Abdominal:     Palpations: Abdomen is soft.     Tenderness: There is no abdominal tenderness.  Skin:    General: Skin is warm.  Neurological:     Mental Status: He is alert and oriented to person, place, and time. Mental status is at baseline.  Psychiatric:        Mood and Affect: Mood normal.    Procedures  Procedures  ED Course / MDM    Medical Decision Making Amount and/or Complexity of Data Reviewed Labs: ordered.  Risk Prescription drug management.   Lab evaluation is reassuring.  Vitals have remained stable.  Patient is very well-appearing.  Plan for discharge with constipation regimen.  Patient at this time appears safe and stable for discharge and close outpatient follow up. Discharge plan and strict return to ED precautions discussed, patient verbalizes understanding and agreement.       Lorelle Gibbs, [place] 09/20/21 [phone]

## 2021-09-20 NOTE — ED Triage Notes (Signed)
Pt c/o rectal pain, states he has been constipated on and off for months since he had a colonoscopy.

## 2021-09-20 NOTE — Telephone Encounter (Signed)
This pt showed up here this morning looking to be sick as "all get out". He was wanting to see you. He advised you did a procedure on him and when I looked at last ov it was 05/2020. He's complaining of vomiting and abdomen pain. I instructed the pt to follow the ER instructions as to what to do because he had just left there. He was not wanting to follow the ER instructions. His blood work was fine he said but they didn't do anything for him that is why he came here. I don't know what he wants you to do but I advised him that I would advise you of this matter.

## 2021-09-20 NOTE — Telephone Encounter (Signed)
Noted   Please schedule office visit with App or Dr. Marletta Lor.  thankls

## 2021-09-20 NOTE — Discharge Instructions (Addendum)
You have been seen and discharged from the emergency department.  Your blood work was normal.  Constipation is most likely contributing to your symptoms.  Use medications that have been prescribed for relief.  Stay well-hydrated.  Eat a high-fiber diet.  Follow-up with your primary provider for further evaluation and further care. Take home medications as prescribed. If you have any worsening symptoms or further concerns for your health please return to an emergency department for further evaluation.

## 2021-09-20 NOTE — Telephone Encounter (Signed)
Does not appear that patient ever followed up after his procedures.  He needs office visit with either an app or myself.  Thank you

## 2021-09-20 NOTE — ED Provider Notes (Signed)
[facility] EMERGENCY DEPARTMENT Provider Note   CSN: [id number] Arrival date & time: 09/20/21  0539     History  Chief Complaint  Patient presents with   Rectal Pain    RUTGER FEEST is a 56 y.o. male.  HPI     This is a 56 year old male with history of inguinal hernia repair who presents with nausea, vomiting, rectal pain.  Patient reports that he had inguinal hernia repair in 2014.  Since that time he has had intermittent episodes of constipation and vomiting.  He has been seen and evaluated.  He ultimately had a colonoscopy and endoscopy that was largely reassuring.  He is on Bentyl and Xanax.  Patient states that 2 nights ago he had an episode where he was on the toilet and straining to have a bowel movement.  He subsequently vomited once.  It was nonbilious and nonbloody.  Since that time he has had ongoing and worsening rectal pain.  Has not noted any rectal hemorrhoids.  Has not noted any rectal bleeding.  Denies fevers.  Patient states he takes Metamucil twice a week but otherwise is not on any stool softeners or laxatives.  Patient does report intermittent abdominal cramping when he has these episodes; however, he is not currently having any abdominal pain.  Colonoscopy and endoscopy reviewed from 2021.  Internal hemorrhoids and diverticuli noted. Home Medications Prior to Admission medications   Medication Sig Start Date End Date Taking? Authorizing Provider  ALPRAZolam Prudy Feeler) 1 MG tablet Take 1 mg by mouth 3 (three) times daily as needed for anxiety.  05/11/20   [provider]  Aspirin-Salicylamide-Caffeine (BC HEADACHE POWDER PO) Take 1 Package by mouth daily as needed (Headache).     [provider]  dicyclomine (BENTYL) 20 MG tablet Take 1 tablet (20 mg total) by mouth 2 (two) times daily. 06/03/20 07/03/20  Lanelle Bal, DO  Omega-3 Fatty Acids (FISH OIL) 1000 MG CAPS Take 1 capsule by mouth daily.    [provider]  omeprazole (PRILOSEC)  40 MG capsule Take 1 capsule (40 mg total) by mouth daily. 06/07/20 12/04/20  Lanelle Bal, DO  ondansetron (ZOFRAN ODT) 4 MG disintegrating tablet 4mg  ODT q4 hours prn nausea/vomit Patient taking differently: Take 4 mg by mouth every 4 (four) hours as needed for nausea or vomiting.  06/03/20   Lanelle Bal, DO      Allergies    Patient has no known allergies.    Review of Systems   Review of Systems  Constitutional:  Negative for fever.  Gastrointestinal:  Positive for constipation, nausea, rectal pain and vomiting. Negative for blood in stool.  All other systems reviewed and are negative.  Physical Exam Updated Vital Signs BP 124/78    Pulse 82    Temp 98.2 F (36.8 C) (Oral)    Resp 17    Ht 1.778 m (5\' 10" )    Wt 79.4 kg    SpO2 99%    BMI 25.11 kg/m  Physical Exam Vitals and nursing note reviewed.  Constitutional:      Appearance: He is well-developed. He is not ill-appearing.  HENT:     Head: Normocephalic and atraumatic.     Nose: Nose normal.     Mouth/Throat:     Mouth: Mucous membranes are moist.  Eyes:     Pupils: Pupils are equal, round, and reactive to light.  Cardiovascular:     Rate and Rhythm: Normal rate and regular rhythm.  Heart sounds: Normal heart sounds. No murmur heard. Pulmonary:     Effort: Pulmonary effort is normal. No respiratory distress.     Breath sounds: Normal breath sounds. No wheezing.  Abdominal:     General: Bowel sounds are normal.     Palpations: Abdomen is soft.     Tenderness: There is no abdominal tenderness. There is no rebound.  Genitourinary:    Comments: External hemorrhoids noted, nonthrombosed, not bleeding, pain with digital rectal exam, no obvious anal fissure or abscess Musculoskeletal:     Cervical back: Neck supple.  Lymphadenopathy:     Cervical: No cervical adenopathy.  Skin:    General: Skin is warm and dry.  Neurological:     Mental Status: He is alert and oriented to person, place, and time.    ED  Results / Procedures / Treatments   Labs (all labs ordered are listed, but only abnormal results are displayed) Labs Reviewed  CBC WITH DIFFERENTIAL/PLATELET  COMPREHENSIVE METABOLIC PANEL  LIPASE, BLOOD  POC OCCULT BLOOD, ED    EKG None  Radiology No results found.  Procedures Procedures    Medications Ordered in ED Medications - No data to display  ED Course/ Medical Decision Making/ A&P                           Medical Decision Making Amount and/or Complexity of Data Reviewed Labs: ordered.   This patient presents to the ED for concern of rectal pain, nausea vomiting, this involves an extensive number of treatment options, and is a complaint that carries with it a high risk of complications and morbidity.  The differential diagnosis includes constipation, hemorrhoids, anal fissure, less likely SBO  MDM:    This is a 56 year old male who presents today with rectal pain.  Recent history of straining that resulted in a hard bowel movement and vomiting.  He is nontoxic.  No ongoing abdominal pain.  Abdomen is benign today.  He has external hemorrhoids on exam that are nonthrombosed and not bleeding.  Likely the cause of his discomfort.  Patient is very concerned regarding these episodes of straining with a bowel movement that is resulting in vomiting.  I suspect this is related to forceful Valsalva.  However, will obtain some basic lab work for reassurance.  Labs are pending. (Labs, imaging)  Labs: I Ordered, and personally interpreted labs.  The pertinent results include: Pending  Imaging Studies ordered: I ordered imaging studies including N/A I independently visualized and interpreted imaging. I agree with the radiologist interpretation  Additional history obtained from patient.  External records from outside source obtained and reviewed including prior endoscopy and colonoscopy  Critical Interventions: N/A  Consultations: I requested consultation with the none,   and discussed lab and imaging findings as well as pertinent plan - they recommend: None  Cardiac Monitoring: The patient was maintained on a cardiac monitor.  I personally viewed and interpreted the cardiac monitored which showed an underlying rhythm of: Normal sinus rhythm  Reevaluation: After the interventions noted above, I reevaluated the patient and found that they have :stayed the same   Considered admission for: N/A  Social Determinants of Health: Lives independently  Disposition: Likely discharge  Co morbidities that complicate the patient evaluation  Past Medical History:  Diagnosis Date   GAD (generalized anxiety disorder)    Inguinal hernia      Medicines No orders of the defined types were placed in this encounter.  I have reviewed the patients home medicines and have made adjustments as needed  Problem List / ED Course: Problem List Items Addressed This Visit       Digestive   Nausea and vomiting   Other Visit Diagnoses     Rectal pain    -  Primary                   Final Clinical Impression(s) / ED Diagnoses Final diagnoses:  Rectal pain  Nausea and vomiting, unspecified vomiting type    Rx / DC Orders ED Discharge Orders     None         Shon Baton, MD 09/20/21 [phone]

## 2021-09-21 ENCOUNTER — Inpatient Hospital Stay (HOSPITAL_COMMUNITY)
Admission: EM | Admit: 2021-09-21 | Discharge: 2021-09-24 | DRG: 344 | Disposition: A | Discharge status: Home / Self Care | Payer: Self-pay | Attending: Internal Medicine | Admitting: Internal Medicine

## 2021-09-21 ENCOUNTER — Inpatient Hospital Stay (HOSPITAL_COMMUNITY): Payer: Self-pay

## 2021-09-21 ENCOUNTER — Emergency Department (HOSPITAL_COMMUNITY): Payer: Self-pay

## 2021-09-21 ENCOUNTER — Encounter: Payer: Self-pay | Admitting: Internal Medicine

## 2021-09-21 ENCOUNTER — Encounter (HOSPITAL_COMMUNITY): Payer: Self-pay | Admitting: Internal Medicine

## 2021-09-21 ENCOUNTER — Other Ambulatory Visit: Payer: Self-pay

## 2021-09-21 ENCOUNTER — Telehealth: Payer: Self-pay | Admitting: Internal Medicine

## 2021-09-21 DIAGNOSIS — K219 Gastro-esophageal reflux disease without esophagitis: Secondary | ICD-10-CM

## 2021-09-21 DIAGNOSIS — K644 Residual hemorrhoidal skin tags: Secondary | ICD-10-CM | POA: Diagnosis present

## 2021-09-21 DIAGNOSIS — F419 Anxiety disorder, unspecified: Secondary | ICD-10-CM

## 2021-09-21 DIAGNOSIS — D696 Thrombocytopenia, unspecified: Secondary | ICD-10-CM

## 2021-09-21 DIAGNOSIS — K603 Anal fistula, unspecified: Secondary | ICD-10-CM

## 2021-09-21 DIAGNOSIS — U071 COVID-19: Secondary | ICD-10-CM | POA: Diagnosis present

## 2021-09-21 DIAGNOSIS — R338 Other retention of urine: Secondary | ICD-10-CM

## 2021-09-21 DIAGNOSIS — Z87891 Personal history of nicotine dependence: Secondary | ICD-10-CM

## 2021-09-21 DIAGNOSIS — Z801 Family history of malignant neoplasm of trachea, bronchus and lung: Secondary | ICD-10-CM

## 2021-09-21 DIAGNOSIS — Z79899 Other long term (current) drug therapy: Secondary | ICD-10-CM

## 2021-09-21 DIAGNOSIS — K610 Anal abscess: Principal | ICD-10-CM | POA: Diagnosis present

## 2021-09-21 DIAGNOSIS — F411 Generalized anxiety disorder: Secondary | ICD-10-CM | POA: Diagnosis present

## 2021-09-21 LAB — CBC WITH DIFFERENTIAL/PLATELET
Abs Immature Granulocytes: 0.04 10*3/uL (ref 0.00–0.07)
Basophils Absolute: 0 10*3/uL (ref 0.0–0.1)
Basophils Relative: 0 %
Eosinophils Absolute: 0.1 10*3/uL (ref 0.0–0.5)
Eosinophils Relative: 1 %
HCT: 41.1 % (ref 39.0–52.0)
Hemoglobin: 14.4 g/dL (ref 13.0–17.0)
Immature Granulocytes: 0 %
Lymphocytes Relative: 10 %
Lymphs Abs: 1.4 10*3/uL (ref 0.7–4.0)
MCH: 32 pg (ref 26.0–34.0)
MCHC: 35 g/dL (ref 30.0–36.0)
MCV: 91.3 fL (ref 80.0–100.0)
Monocytes Absolute: 0.8 10*3/uL (ref 0.1–1.0)
Monocytes Relative: 6 %
Neutro Abs: 10.6 10*3/uL — ABNORMAL HIGH (ref 1.7–7.7)
Neutrophils Relative %: 83 %
Platelets: 146 10*3/uL — ABNORMAL LOW (ref 150–400)
RBC: 4.5 MIL/uL (ref 4.22–5.81)
RDW: 12.3 % (ref 11.5–15.5)
WBC: 13 10*3/uL — ABNORMAL HIGH (ref 4.0–10.5)
nRBC: 0 % (ref 0.0–0.2)

## 2021-09-21 LAB — BASIC METABOLIC PANEL
Anion gap: 7 (ref 5–15)
BUN: 13 mg/dL (ref 6–20)
CO2: 27 mmol/L (ref 22–32)
Calcium: 9 mg/dL (ref 8.9–10.3)
Chloride: 100 mmol/L (ref 98–111)
Creatinine, Ser: 0.79 mg/dL (ref 0.61–1.24)
GFR, Estimated: >60 mL/min (ref >60)
Glucose, Bld: 105 mg/dL — ABNORMAL HIGH (ref 70–99)
Potassium: 3.9 mmol/L (ref 3.5–5.1)
Sodium: 134 mmol/L — ABNORMAL LOW (ref 135–145)

## 2021-09-21 LAB — RESP PANEL BY RT-PCR (FLU A&B, COVID) ARPGX2
Influenza A by PCR: NEGATIVE
Influenza B by PCR: NEGATIVE
SARS Coronavirus 2 by RT PCR: POSITIVE — AB

## 2021-09-21 LAB — PROCALCITONIN: Procalcitonin: <0.1 ng/mL

## 2021-09-21 LAB — URINALYSIS, ROUTINE W REFLEX MICROSCOPIC
Bilirubin Urine: NEGATIVE
Glucose, UA: NEGATIVE mg/dL
Hgb urine dipstick: NEGATIVE
Ketones, ur: NEGATIVE mg/dL
Nitrite: NEGATIVE
Protein, ur: NEGATIVE mg/dL
Specific Gravity, Urine: 1.012 (ref 1.005–1.030)
pH: 6 (ref 5.0–8.0)

## 2021-09-21 LAB — LACTATE DEHYDROGENASE: LDH: 107 U/L (ref 98–192)

## 2021-09-21 LAB — D-DIMER, QUANTITATIVE: D-Dimer, Quant: 0.97 ug/mL-FEU — ABNORMAL HIGH (ref 0.00–0.50)

## 2021-09-21 LAB — FERRITIN: Ferritin: 71 ng/mL (ref 24–336)

## 2021-09-21 MED ORDER — IOHEXOL 300 MG/ML  SOLN
100.0000 mL | Freq: Once | INTRAMUSCULAR | Status: AC | PRN
  Administered 2021-09-21: 100 mL via INTRAVENOUS

## 2021-09-21 MED ORDER — ACETAMINOPHEN 650 MG RE SUPP: 650.0000 mg | Freq: Four times a day (QID) | RECTAL | Status: DC | PRN

## 2021-09-21 MED ORDER — CHLORHEXIDINE GLUCONATE CLOTH 2 % EX PADS
6.0000 | MEDICATED_PAD | Freq: Once | CUTANEOUS | Status: AC
  Administered 2021-09-21: 6 via TOPICAL

## 2021-09-21 MED ORDER — ONDANSETRON HCL 4 MG/2ML IJ SOLN
4.0000 mg | Freq: Once | INTRAMUSCULAR | Status: AC
  Administered 2021-09-21: 4 mg via INTRAVENOUS
  Filled 2021-09-21: qty 2

## 2021-09-21 MED ORDER — ACETAMINOPHEN 325 MG PO TABS: 650.0000 mg | ORAL_TABLET | Freq: Four times a day (QID) | ORAL | Status: DC | PRN

## 2021-09-21 MED ORDER — HYDROMORPHONE HCL 1 MG/ML IJ SOLN
1.0000 mg | INTRAMUSCULAR | Status: DC | PRN
  Administered 2021-09-21: 1 mg via INTRAVENOUS
  Filled 2021-09-21: qty 1

## 2021-09-21 MED ORDER — ALPRAZOLAM 1 MG PO TABS
1.0000 mg | ORAL_TABLET | Freq: Three times a day (TID) | ORAL | Status: DC | PRN
  Administered 2021-09-21: 1 mg via ORAL
  Filled 2021-09-21: qty 1

## 2021-09-21 MED ORDER — NIRMATRELVIR/RITONAVIR (PAXLOVID)TABLET
3.0000 | ORAL_TABLET | Freq: Two times a day (BID) | ORAL | Status: DC
  Administered 2021-09-21: 3 via ORAL
  Filled 2021-09-21: qty 30

## 2021-09-21 MED ORDER — HYDROMORPHONE HCL 4 MG/ML IJ SOLN
1.0000 mg | INTRAMUSCULAR | Status: DC | PRN
  Administered 2021-09-21 – 2021-09-22 (×2): 1 mg via INTRAVENOUS
  Filled 2021-09-21 (×3): qty 1

## 2021-09-21 MED ORDER — PIPERACILLIN-TAZOBACTAM 3.375 G IVPB
3.3750 g | Freq: Three times a day (TID) | INTRAVENOUS | Status: DC
  Administered 2021-09-21 – 2021-09-24 (×8): 3.375 g via INTRAVENOUS
  Filled 2021-09-21 (×8): qty 50

## 2021-09-21 MED ORDER — HYDROMORPHONE HCL 1 MG/ML IJ SOLN
1.0000 mg | Freq: Once | INTRAMUSCULAR | Status: AC
  Administered 2021-09-21: 1 mg via INTRAVENOUS
  Filled 2021-09-21: qty 1

## 2021-09-21 MED ORDER — PIPERACILLIN-TAZOBACTAM 3.375 G IVPB 30 MIN
3.3750 g | Freq: Once | INTRAVENOUS | Status: AC
  Administered 2021-09-21: 3.375 g via INTRAVENOUS
  Filled 2021-09-21: qty 50

## 2021-09-21 MED ORDER — ONDANSETRON HCL 4 MG PO TABS: 4.0000 mg | ORAL_TABLET | Freq: Three times a day (TID) | ORAL | Status: DC | PRN

## 2021-09-21 MED ORDER — SODIUM CHLORIDE 0.9 % IV SOLN: INTRAVENOUS | Status: AC

## 2021-09-21 MED ORDER — POLYETHYLENE GLYCOL 3350 17 G PO PACK
17.0000 g | PACK | Freq: Every day | ORAL | Status: DC | PRN
  Administered 2021-09-23: 17 g via ORAL
  Filled 2021-09-21: qty 1

## 2021-09-21 MED ORDER — ONDANSETRON HCL 4 MG/2ML IJ SOLN: 4.0000 mg | Freq: Three times a day (TID) | INTRAMUSCULAR | Status: DC | PRN

## 2021-09-21 MED ORDER — CHLORHEXIDINE GLUCONATE CLOTH 2 % EX PADS
6.0000 | MEDICATED_PAD | Freq: Once | CUTANEOUS | Status: AC
  Administered 2021-09-22: 6 via TOPICAL

## 2021-09-21 NOTE — ED Triage Notes (Signed)
Last BM 09/18/2021, Pt dx with internal hemorrhoids yesterday at [facility] ED. Pt was given miralax yesterday and has drank entire bottle. Pt also reports he has not voided this morning.

## 2021-09-21 NOTE — ED Provider Notes (Signed)
[facility] EMERGENCY DEPARTMENT Provider Note   CSN: [id number] Arrival date & time: 09/21/21  1044     History  Chief Complaint  Patient presents with   Constipation   rectal paiin    Todd Reilly is a 56 y.o. male.   Constipation Associated symptoms: no nausea and no vomiting        Todd Reilly is a 56 y.o. male with past medical history of inguinal hernia with repair in 2014 who presents to the Emergency Department complaining of persistent rectal pain and urinary retention.  He was seen in this emergency department yesterday and evaluated for hemorrhoids and constipation.  He states he is taken nearly an entire bottle of MiraLAX without relief.  No urge to defecate or urinate today.  He last urinated at 5 AM this morning.  States he is only able to void small amounts.  No hematuria.  He continues to have severe rectal pain and states he is unable to sit in a chair or walk long distances due to his pain.  He denies any upper abdominal pain, fever, chills, nausea or vomiting.  He is also applied rectal cream without relief.  States the only thing that seems to help is warm heat to his rectum.  He denies any prior history of urinary retention, kidney stones or enlarged prostate.     Home Medications Prior to Admission medications   Medication Sig Start Date End Date Taking? Authorizing Provider  ALPRAZolam Duanne Moron) 1 MG tablet Take 1 mg by mouth 3 (three) times daily as needed for anxiety.  05/11/20   [provider]  Aspirin-Salicylamide-Caffeine (BC HEADACHE POWDER PO) Take 1 Package by mouth daily as needed (Headache).     [provider]  dicyclomine (BENTYL) 20 MG tablet Take 1 tablet (20 mg total) by mouth 2 (two) times daily. 06/03/20 07/03/20  Eloise Harman, DO  docusate sodium (COLACE) 250 MG capsule Take 1 capsule (250 mg total) by mouth daily. 09/20/21   Horton, Alvin Critchley, DO  hydrocortisone (ANUSOL-HC) 2.5 % rectal cream Place 1 application rectally  2 (two) times daily. 09/20/21   Horton, Alvin Critchley, DO  Omega-3 Fatty Acids (FISH OIL) 1000 MG CAPS Take 1 capsule by mouth daily.    [provider]  omeprazole (PRILOSEC) 40 MG capsule Take 1 capsule (40 mg total) by mouth daily. 06/07/20 12/04/20  Eloise Harman, DO  ondansetron (ZOFRAN ODT) 4 MG disintegrating tablet 4mg  ODT q4 hours prn nausea/vomit Patient taking differently: Take 4 mg by mouth every 4 (four) hours as needed for nausea or vomiting.  06/03/20   Eloise Harman, DO      Allergies    Patient has no known allergies.    Review of Systems   Review of Systems  Respiratory:  Negative for shortness of breath.   Cardiovascular:  Negative for chest pain.  Gastrointestinal:  Positive for abdominal distention, constipation and rectal pain. Negative for anal bleeding, blood in stool, nausea and vomiting.  Genitourinary:  Positive for difficulty urinating.  All other systems reviewed and are negative.  Physical Exam Updated Vital Signs BP (!) 140/100    Pulse 66    Temp 97.8 F (36.6 C) (Oral)    Resp 18    Ht 5\' 10"  (1.778 m)    Wt 79.4 kg    SpO2 100%    BMI 25.11 kg/m  Physical Exam Vitals and nursing note reviewed.  Constitutional:  Comments: Patient is very uncomfortable appearing.  Unable to lie still on the stretcher.  Cardiovascular:     Rate and Rhythm: Normal rate and regular rhythm.     Pulses: Normal pulses.  Pulmonary:     Effort: Pulmonary effort is normal. No respiratory distress.     Breath sounds: Normal breath sounds. No wheezing.  Abdominal:     General: There is distension.     Palpations: There is no mass.     Tenderness: There is abdominal tenderness. There is no guarding.     Comments: Diffuse tenderness of the lower abdomen.  Mildly distended.  No guarding or rebound.  No tenderness of the upper abdomen no CVA tenderness on exam.  Genitourinary:    Rectum: Tenderness present.     Comments: Some perirectal firmness on exam.  There are  some external hemorrhoids that are soft, non thrombosed.  No erythema.  No anal fissures seen on my exam. Neurological:     Mental Status: He is alert.    ED Results / Procedures / Treatments   Labs (all labs ordered are listed, but only abnormal results are displayed) Labs Reviewed  CBC WITH DIFFERENTIAL/PLATELET - Abnormal; Notable for the following components:      Result Value   WBC 13.0 (*)    Platelets 146 (*)    Neutro Abs 10.6 (*)    All other components within normal limits  BASIC METABOLIC PANEL - Abnormal; Notable for the following components:   Sodium 134 (*)    Glucose, Bld 105 (*)    All other components within normal limits  URINALYSIS, ROUTINE W REFLEX MICROSCOPIC - Abnormal; Notable for the following components:   Leukocytes,Ua SMALL (*)    Bacteria, UA RARE (*)    All other components within normal limits  RESP PANEL BY RT-PCR (FLU A&B, COVID) [id number]    EKG None  Radiology DG Abdomen 1 View  Result Date: 09/21/2021 CLINICAL DATA:  Constipation.  Rectal pain. EXAM: ABDOMEN - 1 VIEW COMPARISON:  05/21/2020 FINDINGS: The bowel gas pattern is normal. No radiographic signs of constipation. Several right pelvic phleboliths remains stable. No radio-opaque calculi or other significant radiographic abnormality are seen. IMPRESSION: Negative. Electronically Signed   By: Marlaine Hind M.D.   On: 09/21/2021 13:12   CT ABDOMEN PELVIS W CONTRAST  Result Date: 09/21/2021 CLINICAL DATA:  Rectal pain and constipation. EXAM: CT ABDOMEN AND PELVIS WITH CONTRAST TECHNIQUE: Multidetector CT imaging of the abdomen and pelvis was performed using the standard protocol following bolus administration of intravenous contrast. RADIATION DOSE REDUCTION: This exam was performed according to the departmental dose-optimization program which includes automated exposure control, adjustment of the mA and/or kV according to patient size and/or use of iterative reconstruction technique. CONTRAST:   176mL OMNIPAQUE IOHEXOL 300 MG/ML  SOLN COMPARISON:  CT scan 05/06/2020 FINDINGS: Lower chest: The lung bases are clear of acute process. No pleural effusion or pulmonary lesions. The heart is normal in size. No pericardial effusion. The distal esophagus and aorta are unremarkable. Hepatobiliary: No hepatic lesions or intrahepatic biliary dilatation. The gallbladder is unremarkable. No common bile duct dilatation. Pancreas: No mass, inflammation or ductal dilatation. Spleen: Normal size.  No focal lesions. Adrenals/Urinary Tract: The adrenal glands are normal. No renal lesions, hydronephrosis or renal calculi. No findings suspicious for pyelonephritis. The bladder contains a Foley catheter and is decompressed. Stomach/Bowel: Stomach, duodenum, small bowel and colon are unremarkable. No acute inflammatory changes, mass lesions or obstructive findings. The terminal  ileum and appendix are normal. Moderate sigmoid colon diverticulosis without findings for acute diverticulitis. Vascular/Lymphatic: Scattered atherosclerotic calcifications involving the aorta but no aneurysm or dissection. The branch vessels are patent. The major venous structures are patent. No mesenteric or retroperitoneal mass or adenopathy the. Reproductive: The prostate gland and seminal vesicles are unremarkable. 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 MRI pelvis without and with contrast using perianal fistula protocol may be helpful for further evaluation if clinically necessary or for pre-surgical planning. The abscess measures approximately 2.6 x 2.1 cm. Musculoskeletal: No significant bony findings. IMPRESSION: 1. 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 MRI pelvis without and with contrast using perianal fistula protocol may be helpful for further evaluation if clinically necessary or for pre-surgical planning. 2. No acute abdominal/pelvic  findings and no mass lesions or adenopathy. 3. Aortic atherosclerosis. Aortic Atherosclerosis (ICD10-I70.0). Electronically Signed   By: Marijo Sanes M.D.   On: 09/21/2021 15:15    Procedures Procedures    Medications Ordered in ED Medications  HYDROmorphone (DILAUDID) injection 1 mg (1 mg Intravenous Given 09/21/21 1342)  ondansetron (ZOFRAN) injection 4 mg (4 mg Intravenous Given 09/21/21 1343)  iohexol (OMNIPAQUE) 300 MG/ML solution 100 mL (100 mLs Intravenous Contrast Given 09/21/21 1451)  HYDROmorphone (DILAUDID) injection 1 mg (1 mg Intravenous Given 09/21/21 1523)    ED Course/ Medical Decision Making/ A&P                           Medical Decision Making Patient here with 2 to 3-day history of rectal pain and constipation.  Seen here Monday night for same.  Today unable to urinate since 5 AM.  No fever, chills, nausea or vomiting, no history of kidney disease or prior urinary retention.  Unable to sit or walk for long distances due to level of pain.  Differential would include internal hemorrhoids, constipation, rectal abscess, carcinoma  Foley catheter placed and 1400 cc straw-colored urine drained from the bladder.  Patient reports feeling better.  Amount and/or Complexity of Data Reviewed External Data Reviewed: notes.    Details: Prior medical records reviewed by me including ER note from 09/19/2021 Labs: ordered.    Details: Labs today interpreted by me, mild leukocytosis of 13,000, increased from 2 days ago.  Electrolytes reassuring.  Urinalysis shows small leukocytes with rare bacteria 6-10 white cells. Radiology: ordered.    Details: 1 view of the abdomen read as negative.  No radiographic signs of constipation.  Patient continues to have significant rectal pain, will proceed with CT imaging  CT abdomen and pelvis show nearly 3 cm perirectal abscess due to perianal fistula at 5 o'clock position.  Radiology recommending MRI pelvis for further evaluation Discussion of  management or test interpretation with external provider(s): Discussed findings with general surgery, Dr. Constance Haw.  Further imaging not needed at this time.  She recommends IV Zosyn and admit to hospitalist service.  Patient n.p.o. after midnight.  She will consult in the morning  Discussed findings with [facility] hospitalist, Dr. Arlyce Dice who agrees to admit.  Risk Prescription drug management. Decision regarding hospitalization. Minor surgery with no identified risk factors.           Final Clinical Impression(s) / ED Diagnoses Final diagnoses:  Perianal abscess  Perianal fistula    Rx / DC Orders ED Discharge Orders  None         Kem Parkinson, PA-C 09/21/21 1845    Elnora Morrison, MD 09/25/21 2342

## 2021-09-21 NOTE — Consult Note (Signed)
[facility] Consult  Reason for Consult: Perianal abscess   Referring Physician: Nestor Lewandowsky, PA   Chief Complaint   Constipation; rectal paiin     HPI: Todd Reilly is a 56 y.o. male with reported severe rectal pain and swelling, constipation for the last few days. He came to the ED yesterday and was told he had hemorrhoids and was told to take miralax. He returned today with worsening pain. He has a history of abdominal pain and gastritis and issues with constipation in the past. He had an EGD and colonoscopy with Abbey Chatters in 2021 that demonstrated internal hemorrhoids, diverticula and gastritis. The patient has been on a PPI since that time. He says that this pain is sharp and he cannot barely move. He also had issues with not being able to void and had to be catheterized in the ED after pain medication was given, with over 1200L out per report.   Past Medical History:  Diagnosis Date   GAD (generalized anxiety disorder)    Inguinal hernia     Past Surgical History:  Procedure Laterality Date   BIOPSY  06/07/2020   Procedure: BIOPSY;  Surgeon: Eloise Harman, DO;  Location: [facility];  Service: Endoscopy;;   COLONOSCOPY WITH PROPOFOL N/A 06/07/2020   Procedure: COLONOSCOPY WITH PROPOFOL;  Surgeon: Eloise Harman, DO;  Location: [facility];  Service: Endoscopy;  Laterality: N/A;  10:00am   ESOPHAGOGASTRODUODENOSCOPY (EGD) WITH PROPOFOL N/A 06/07/2020   Procedure: ESOPHAGOGASTRODUODENOSCOPY (EGD) WITH PROPOFOL;  Surgeon: Eloise Harman, DO;  Location: [facility];  Service: Endoscopy;  Laterality: N/A;   INGUINAL HERNIA REPAIR Left 02/14/2013   Procedure: HERNIA REPAIR INGUINAL INCARCERATED;  Surgeon: Donato Heinz, MD;  Location: [facility];  Service: General;  Laterality: Left;   INSERTION OF MESH Left 02/14/2013   Procedure: INSERTION OF MESH;  Surgeon: Donato Heinz, MD;  Location: [facility];  Service: General;  Laterality: Left;   OMENTECTOMY N/A  02/14/2013   Procedure: PARTIAL OMENTECTOMY;  Surgeon: Donato Heinz, MD;  Location: [facility];  Service: General;  Laterality: N/A;    Family History  Problem Relation Age of Onset   Lung cancer Mother    Lung cancer Father    Lung cancer Brother     Social History   Tobacco Use   Smoking status: Former   Smokeless tobacco: Never   Tobacco comments:    quit 2005/2006  Vaping Use   Vaping Use: Never used  Substance Use Topics   Alcohol use: Yes    Comment: occas   Drug use: No    Medications: I have reviewed the patient's current medications. Prior to Admission: (Not in a hospital admission)  Scheduled: Continuous:  [START ON 09/22/2021] piperacillin-tazobactam (ZOSYN)  IV     [id number] (DILAUDID) injection  No Known Allergies   ROS:  A comprehensive review of systems was negative except for: Gastrointestinal: positive for rectal pain and constipation Genitourinary: positive for retention  Blood pressure (!) 107/52, pulse 66, temperature 97.8 F (36.6 C), temperature source Oral, resp. rate 18, height 5\' 10"  (1.778 m), weight 79.4 kg, SpO2 96 %. Physical Exam Vitals reviewed.  Constitutional:      Appearance: Normal appearance.  HENT:     Head: Normocephalic.     Mouth/Throat:     Mouth: Mucous membranes are moist.  Eyes:     Extraocular Movements: Extraocular movements intact.  Cardiovascular:     Rate and Rhythm: Normal rate  and regular rhythm.  Pulmonary:     Effort: Pulmonary effort is normal.  Abdominal:     General: There is no distension.     Palpations: Abdomen is soft.     Tenderness: There is no abdominal tenderness.  Genitourinary:    Comments: Left perianal region with bulge/fluctuance, tender , DRE deferred due to pain, small external hemorrhoidal tag on the right side  Musculoskeletal:        General: Normal range of motion.     Cervical back: Normal range of motion.  Skin:    General: Skin is warm.  Neurological:     General: No  focal deficit present.     Mental Status: He is alert and oriented to person, place, and time.  Psychiatric:        Mood and Affect: Mood normal.    Results: Results for orders placed or performed during the hospital encounter of 09/21/21 (from the past 48 hour(s))  CBC with Differential     Status: Abnormal   Collection Time: 09/21/21 12:32 PM  Result Value Ref Range   WBC 13.0 (H) 4.0 - 10.5 K/uL   RBC 4.50 4.22 - 5.81 MIL/uL   Hemoglobin 14.4 13.0 - 17.0 g/dL   HCT 41.1 39.0 - 52.0 %   MCV 91.3 80.0 - 100.0 fL   MCH 32.0 26.0 - 34.0 pg   MCHC 35.0 30.0 - 36.0 g/dL   RDW 12.3 11.5 - 15.5 %   Platelets 146 (L) 150 - 400 K/uL   nRBC 0.0 0.0 - 0.2 %   Neutrophils Relative % 83 %   Neutro Abs 10.6 (H) 1.7 - 7.7 K/uL   Lymphocytes Relative 10 %   Lymphs Abs 1.4 0.7 - 4.0 K/uL   Monocytes Relative 6 %   Monocytes Absolute 0.8 0.1 - 1.0 K/uL   Eosinophils Relative 1 %   Eosinophils Absolute 0.1 0.0 - 0.5 K/uL   Basophils Relative 0 %   Basophils Absolute 0.0 0.0 - 0.1 K/uL   Immature Granulocytes 0 %   Abs Immature Granulocytes 0.04 0.00 - 0.07 K/uL    Comment: Performed at [facility], [address], [place], [place] [id number]  Basic metabolic panel     Status: Abnormal   Collection Time: 09/21/21 12:32 PM  Result Value Ref Range   Sodium 134 (L) 135 - 145 mmol/L   Potassium 3.9 3.5 - 5.1 mmol/L    Comment: NO VISIBLE HEMOLYSIS DELTA CHECK NOTED    Chloride 100 98 - 111 mmol/L   CO2 27 22 - 32 mmol/L   Glucose, Bld 105 (H) 70 - 99 mg/dL    Comment: Glucose reference range applies only to samples taken after fasting for at least 8 hours.   BUN 13 6 - 20 mg/dL   Creatinine, Ser 0.79 0.61 - 1.24 mg/dL   Calcium 9.0 8.9 - 10.3 mg/dL   GFR, Estimated >60 >60 mL/min    Comment: (NOTE) Calculated using the CKD-EPI Creatinine Equation (2021)    Anion gap 7 5 - 15    Comment: Performed at [facility], [address], [place], [place] [address]  Urinalysis, Routine w reflex  microscopic     Status: Abnormal   Collection Time: 09/21/21  1:15 PM  Result Value Ref Range   Color, Urine YELLOW YELLOW   APPearance CLEAR CLEAR   Specific Gravity, Urine 1.012 1.005 - 1.030   pH 6.0 5.0 - 8.0   Glucose, UA NEGATIVE NEGATIVE mg/dL  Hgb urine dipstick NEGATIVE NEGATIVE   Bilirubin Urine NEGATIVE NEGATIVE   Ketones, ur NEGATIVE NEGATIVE mg/dL   Protein, ur NEGATIVE NEGATIVE mg/dL   Nitrite NEGATIVE NEGATIVE   Leukocytes,Ua SMALL (A) NEGATIVE   RBC / HPF 0-5 0 - 5 RBC/hpf   WBC, UA 6-10 0 - 5 WBC/hpf   Bacteria, UA RARE (A) NONE SEEN   Squamous Epithelial / LPF 0-5 0 - 5   Mucus PRESENT     Comment: Performed at [facility], [address], [place], [place] [address]   Personally reviewed CT- perianal abscess about 3cm, possible fistula  DG Abdomen 1 View  Result Date: 09/21/2021 CLINICAL DATA:  Constipation.  Rectal pain. EXAM: ABDOMEN - 1 VIEW COMPARISON:  05/21/2020 FINDINGS: The bowel gas pattern is normal. No radiographic signs of constipation. Several right pelvic phleboliths remains stable. No radio-opaque calculi or other significant radiographic abnormality are seen. IMPRESSION: Negative. Electronically Signed   By: Marlaine Hind M.D.   On: 09/21/2021 13:12   CT ABDOMEN PELVIS W CONTRAST  Result Date: 09/21/2021 CLINICAL DATA:  Rectal pain and constipation. EXAM: CT ABDOMEN AND PELVIS WITH CONTRAST TECHNIQUE: Multidetector CT imaging of the abdomen and pelvis was performed using the standard protocol following bolus administration of intravenous contrast. RADIATION DOSE REDUCTION: This exam was performed according to the departmental dose-optimization program which includes automated exposure control, adjustment of the mA and/or kV according to patient size and/or use of iterative reconstruction technique. CONTRAST:  171mL OMNIPAQUE IOHEXOL 300 MG/ML  SOLN COMPARISON:  CT scan 05/06/2020 FINDINGS: Lower chest: The lung bases are clear of acute process. No pleural  effusion or pulmonary lesions. The heart is normal in size. No pericardial effusion. The distal esophagus and aorta are unremarkable. Hepatobiliary: No hepatic lesions or intrahepatic biliary dilatation. The gallbladder is unremarkable. No common bile duct dilatation. Pancreas: No mass, inflammation or ductal dilatation. Spleen: Normal size.  No focal lesions. Adrenals/Urinary Tract: The adrenal glands are normal. No renal lesions, hydronephrosis or renal calculi. No findings suspicious for pyelonephritis. The bladder contains a Foley catheter and is decompressed. Stomach/Bowel: Stomach, duodenum, small bowel and colon are unremarkable. No acute inflammatory changes, mass lesions or obstructive findings. The terminal ileum and appendix are normal. Moderate sigmoid colon diverticulosis without findings for acute diverticulitis. Vascular/Lymphatic: Scattered atherosclerotic calcifications involving the aorta but no aneurysm or dissection. The branch vessels are patent. The major venous structures are patent. No mesenteric or retroperitoneal mass or adenopathy the. Reproductive: The prostate gland and seminal vesicles are unremarkable. 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 MRI pelvis without and with contrast using perianal fistula protocol may be helpful for further evaluation if clinically necessary or for pre-surgical planning. The abscess measures approximately 2.6 x 2.1 cm. Musculoskeletal: No significant bony findings. IMPRESSION: 1. 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 MRI pelvis without and with contrast using perianal fistula protocol may be helpful for further evaluation if clinically necessary or for pre-surgical planning. 2. No acute abdominal/pelvic findings and no mass lesions or adenopathy. 3. Aortic atherosclerosis. Aortic Atherosclerosis (ICD10-I70.0). Electronically Signed   By: Marijo Sanes  M.D.   On: 09/21/2021 15:15     Assessment & Plan:  Todd Reilly is a 56 y.o. male with perianal abscess and possible fistula. Denies any Crohn's or IBD. He has never had a perianal abscess before.   -Discussed exam under  anesthesia, risk of bleeding, infection, needing a drain or seton, possible incision and drainage, fistulotomy versus seton, needing more surgery, injury to the sphincter complex  -Dilaudid ordered for pain -IV antibiotics -NPO midnight   All questions were answered to the satisfaction of the patient.  Appreciate Dr. Denton Brick helping with admission.    Virl Cagey 09/21/2021, 6:30 PM

## 2021-09-21 NOTE — H&P (Addendum)
History and Physical    Todd Reilly:[id number] DOB: November 23, 1965 DOA: 09/21/2021  PCP: Elfredia Nevins, MD   Patient coming from: Home  I have personally briefly reviewed patient's old medical records in [facility]  Chief Complaint: Rectal pain  HPI: Todd Reilly is a 56 y.o. male with medical history significant for  GAD presented to the Ed with complaints of rectal pain over the past few days. He has had issues with constipation over the past several months. He was in the ED yesterday, felt to have hemorrhoids and discharged with conservative management. Reports early this morning was unable to void.  The pain in his rectum got worse.  Was also unable to have a bowel movement. He denies vomiting, denies loose stools no abdominal pain.  He denies fevers.  No pain with urination.  Denies alcohol or tobacco use.  ED Course: Vitals stable.  WBC 13.  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 Dr. Henreitta Leber was consulted, will see in the morning, plan for exam under anesthesia and I&D of abscess and fistulotomy versus seton tomorrow. IV Zosyn started.  Hospitalist to admit.  Review of Systems: As per HPI all other systems reviewed and negative.  Past Medical History:  Diagnosis Date   GAD (generalized anxiety disorder)    Inguinal hernia     Past Surgical History:  Procedure Laterality Date   BIOPSY  06/07/2020   Procedure: BIOPSY;  Surgeon: Lanelle Bal, DO;  [facility];  Service: Endoscopy;;   COLONOSCOPY WITH PROPOFOL N/A 06/07/2020   Procedure: COLONOSCOPY WITH PROPOFOL;  Surgeon: Lanelle Bal, DO;  [facility];  Service: Endoscopy;  Laterality: N/A;  10:00am   ESOPHAGOGASTRODUODENOSCOPY (EGD) WITH PROPOFOL N/A 06/07/2020   Procedure: ESOPHAGOGASTRODUODENOSCOPY (EGD) WITH PROPOFOL;  Surgeon: Lanelle Bal, DO;  [facility];  Service: Endoscopy;  Laterality: N/A;   INGUINAL  HERNIA REPAIR Left 02/14/2013   Procedure: HERNIA REPAIR INGUINAL INCARCERATED;  Surgeon: Fabio Bering, MD;  Location: [facility];  Service: General;  Laterality: Left;   INSERTION OF MESH Left 02/14/2013   Procedure: INSERTION OF MESH;  Surgeon: Fabio Bering, MD;  Location: [facility];  Service: General;  Laterality: Left;   OMENTECTOMY N/A 02/14/2013   Procedure: PARTIAL OMENTECTOMY;  Surgeon: Fabio Bering, MD;  Location: [facility];  Service: General;  Laterality: N/A;     reports that he has quit smoking. He has never used smokeless tobacco. He reports current alcohol use. He reports that he does not use drugs.  No Known Allergies  Family History  Problem Relation Age of Onset   Lung cancer Mother    Lung cancer Father    Lung cancer Brother     Prior to Admission medications   Medication Sig Start Date End Date Taking? Authorizing Provider  ALPRAZolam Prudy Feeler) 1 MG tablet Take 1 mg by mouth 3 (three) times daily as needed for anxiety.  05/11/20   [provider]  Aspirin-Salicylamide-Caffeine (BC HEADACHE POWDER PO) Take 1 Package by mouth daily as needed (Headache).     [provider]  dicyclomine (BENTYL) 20 MG tablet Take 1 tablet (20 mg total) by mouth 2 (two) times daily. 06/03/20 07/03/20  Lanelle Bal, DO  docusate sodium (COLACE) 250 MG capsule Take 1 capsule (250 mg total) by mouth daily. 09/20/21   Horton, Clabe Seal, DO  hydrocortisone (ANUSOL-HC) 2.5 % rectal  cream Place 1 application rectally 2 (two) times daily. 09/20/21   Horton, Clabe Seal, DO  Omega-3 Fatty Acids (FISH OIL) 1000 MG CAPS Take 1 capsule by mouth daily.    [provider]  omeprazole (PRILOSEC) 40 MG capsule Take 1 capsule (40 mg total) by mouth daily. 06/07/20 12/04/20  Lanelle Bal, DO  ondansetron (ZOFRAN ODT) 4 MG disintegrating tablet 4mg  ODT q4 hours prn nausea/vomit Patient taking differently: Take 4 mg by mouth every 4 (four) hours as needed for nausea or vomiting.   06/03/20   13/4/21, DO    Physical Exam: Vitals:   09/21/21 1410 09/21/21 1530 09/21/21 1630 09/21/21 1700  BP: (!) 140/100 (!) 140/94  (!) 107/52  Pulse: 66 79 80 66  Resp: 18   18  Temp:      TempSrc:      SpO2: 100% 100% 94% 96%  Weight:      Height:        Constitutional: NAD, calm, comfortable Vitals:   09/21/21 1410 09/21/21 1530 09/21/21 1630 09/21/21 1700  BP: (!) 140/100 (!) 140/94  (!) 107/52  Pulse: 66 79 80 66  Resp: 18   18  Temp:      TempSrc:      SpO2: 100% 100% 94% 96%  Weight:      Height:       Eyes: PERRL, lids and conjunctivae normal ENMT: Mucous membranes are moist.   Neck: normal, supple, no masses, no thyromegaly Respiratory: clear to auscultation bilaterally, no wheezing, no crackles. Normal respiratory effort. No accessory muscle use.  Cardiovascular: Regular rate and rhythm, no murmurs / rubs / gallops. No extremity edema.  Lower extremities warm. Abdomen: no tenderness, no masses palpated. No hepatosplenomegaly. Bowel sounds positive.  Musculoskeletal: no clubbing / cyanosis. No joint deformity upper and lower extremities. Good ROM, no contractures. Normal muscle tone.  Skin: no rashes, lesions, ulcers. No induration Neurologic: No apparent cranial nerve abnormality, moving all extremities spontaneously Psychiatric: Normal judgment and insight. Alert and oriented x 3. Normal mood.   Labs on Admission: I have personally reviewed following labs and imaging studies  CBC: Recent Labs  Lab 09/20/21 0653 09/21/21 1232  WBC 11.4* 13.0*  NEUTROABS 9.4* 10.6*  HGB 14.7 14.4  HCT 43.0 41.1  MCV 91.5 91.3  PLT 143* 146*   Basic Metabolic Panel: Recent Labs  Lab 09/20/21 0653 09/21/21 1232  NA 137 134*  K 4.7 3.9  CL 102 100  CO2 29 27  GLUCOSE 114* 105*  BUN 14 13  CREATININE 0.89 0.79  CALCIUM 9.1 9.0   GFR: Estimated Creatinine Clearance: 107.7 mL/min (by C-G formula based on SCr of 0.79 mg/dL). Liver Function  Tests: Recent Labs  Lab 09/20/21 0653  AST 16  ALT 16  ALKPHOS 50  BILITOT 1.0  PROT 7.2  ALBUMIN 3.8   Recent Labs  Lab 09/20/21 0653  LIPASE 28   Urine analysis:    Component Value Date/Time   COLORURINE YELLOW 09/21/2021 1315   APPEARANCEUR CLEAR 09/21/2021 1315   LABSPEC 1.012 09/21/2021 1315   PHURINE 6.0 09/21/2021 1315   GLUCOSEU NEGATIVE 09/21/2021 1315   HGBUR NEGATIVE 09/21/2021 1315   BILIRUBINUR NEGATIVE 09/21/2021 1315   KETONESUR NEGATIVE 09/21/2021 1315   PROTEINUR NEGATIVE 09/21/2021 1315   NITRITE NEGATIVE 09/21/2021 1315   LEUKOCYTESUR SMALL (A) 09/21/2021 1315    Radiological Exams on Admission: DG Abdomen 1 View  Result Date: 09/21/2021 CLINICAL DATA:  Constipation.  Rectal pain. EXAM: ABDOMEN - 1 VIEW COMPARISON:  05/21/2020 FINDINGS: The bowel gas pattern is normal. No radiographic signs of constipation. Several right pelvic phleboliths remains stable. No radio-opaque calculi or other significant radiographic abnormality are seen. IMPRESSION: Negative. Electronically Signed   By: Danae OrleansJohn A Stahl M.D.   On: 09/21/2021 13:12   CT ABDOMEN PELVIS W CONTRAST  Result Date: 09/21/2021 CLINICAL DATA:  Rectal pain and constipation. EXAM: CT ABDOMEN AND PELVIS WITH CONTRAST TECHNIQUE: Multidetector CT imaging of the abdomen and pelvis was performed using the standard protocol following bolus administration of intravenous contrast. RADIATION DOSE REDUCTION: This exam was performed according to the departmental dose-optimization program which includes automated exposure control, adjustment of the mA and/or kV according to patient size and/or use of iterative reconstruction technique. CONTRAST:  100mL OMNIPAQUE IOHEXOL 300 MG/ML  SOLN COMPARISON:  CT scan 05/06/2020 FINDINGS: Lower chest: The lung bases are clear of acute process. No pleural effusion or pulmonary lesions. The heart is normal in size. No pericardial effusion. The distal esophagus and aorta are  unremarkable. Hepatobiliary: No hepatic lesions or intrahepatic biliary dilatation. The gallbladder is unremarkable. No common bile duct dilatation. Pancreas: No mass, inflammation or ductal dilatation. Spleen: Normal size.  No focal lesions. Adrenals/Urinary Tract: The adrenal glands are normal. No renal lesions, hydronephrosis or renal calculi. No findings suspicious for pyelonephritis. The bladder contains a Foley catheter and is decompressed. Stomach/Bowel: Stomach, duodenum, small bowel and colon are unremarkable. No acute inflammatory changes, mass lesions or obstructive findings. The terminal ileum and appendix are normal. Moderate sigmoid colon diverticulosis without findings for acute diverticulitis. Vascular/Lymphatic: Scattered atherosclerotic calcifications involving the aorta but no aneurysm or dissection. The branch vessels are patent. The major venous structures are patent. No mesenteric or retroperitoneal mass or adenopathy the. Reproductive: The prostate gland and seminal vesicles are unremarkable. 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 MRI pelvis without and with contrast using perianal fistula protocol may be helpful for further evaluation if clinically necessary or for pre-surgical planning. The abscess measures approximately 2.6 x 2.1 cm. Musculoskeletal: No significant bony findings. IMPRESSION: 1. 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 MRI pelvis without and with contrast using perianal fistula protocol may be helpful for further evaluation if clinically necessary or for pre-surgical planning. 2. No acute abdominal/pelvic findings and no mass lesions or adenopathy. 3. Aortic atherosclerosis. Aortic Atherosclerosis (ICD10-I70.0). Electronically Signed   By: Rudie MeyerP.  Gallerani M.D.   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 MRI pelvis without and with contrast using perianal fistula protocol may be helpful for further evaluation if clinically necessary or for pre-surgical planning. -Acute urinary retention, Likely due to pelvic floor spasm from rectal pain. In and out Foley catheter placed with about 1.4 L of urine drained, he subsequently voided on his own.  -Leukocytosis of 13, but rules out for sepsis. -IV Zosyn -General surgeon Dr. Henreitta LeberBridges to see in the morning, n.p.o. midnight -IV Dilaudid 1 mg every 4 hourly as needed - N/s 75cc/hr x 15hrs  COVID-19 virus infection- (present on admission) Incidentally positive.  Fully vaccinated.  No symptoms. -  Obtain portable chest x-ray -Paxlovid -Obtain and trend inflammatory markers   DVT prophylaxis: SCDS Code Status: Full Family Communication: None at bedside Disposition Plan: ~ 2 days Consults called:  gen Surg Admission status: Inpt med surg I certify that at the point of admission it is my clinical judgment that the patient will require inpatient hospital care spanning beyond 2 midnights from the point of admission due to high intensity of service, high risk for further deterioration and high frequency of surveillance required.    Onnie Boer MD [facility]  09/21/2021, 6:39 PM

## 2021-09-21 NOTE — Telephone Encounter (Signed)
Dr. Marletta Lor This is the pt who walked I yesterday who has not been seen here in over a year.

## 2021-09-21 NOTE — Assessment & Plan Note (Addendum)
Incidentally positive.  Fully vaccinated.  No symptoms. Stable on RA -personally reviewed--no infiltrates or edema -Paxlovid started>>changed to molnupiravir due to drug interaction of xanax with ritonavir -Ferritin 71>>103>>204>>203 -D-dimer 0.97>> 0.88>>1.78>>1.82 -CRP--2.6>>10.4 -finish 5 more doses molnupiravir after d/c

## 2021-09-21 NOTE — Telephone Encounter (Signed)
Noted  Erline Levine please reach out and schedule appt because further recommendations cannot be made.

## 2021-09-21 NOTE — ED Notes (Signed)
Report called to [facility]. Pt being transported to [facility].

## 2021-09-21 NOTE — H&P (View-Only) (Signed)
[facility] Consult  Reason for Consult: Perianal abscess   Referring Physician: Nestor Lewandowsky, PA   Chief Complaint   Constipation; rectal paiin     HPI: Todd Reilly is a 56 y.o. male with reported severe rectal pain and swelling, constipation for the last few days. He came to the ED yesterday and was told he had hemorrhoids and was told to take miralax. He returned today with worsening pain. He has a history of abdominal pain and gastritis and issues with constipation in the past. He had an EGD and colonoscopy with Abbey Chatters in 2021 that demonstrated internal hemorrhoids, diverticula and gastritis. The patient has been on a PPI since that time. He says that this pain is sharp and he cannot barely move. He also had issues with not being able to void and had to be catheterized in the ED after pain medication was given, with over 1200L out per report.   Past Medical History:  Diagnosis Date   GAD (generalized anxiety disorder)    Inguinal hernia     Past Surgical History:  Procedure Laterality Date   BIOPSY  06/07/2020   Procedure: BIOPSY;  Surgeon: Eloise Harman, DO;  [facility];  Service: Endoscopy;;   COLONOSCOPY WITH PROPOFOL N/A 06/07/2020   Procedure: COLONOSCOPY WITH PROPOFOL;  Surgeon: Eloise Harman, DO;  [facility];  Service: Endoscopy;  Laterality: N/A;  10:00am   ESOPHAGOGASTRODUODENOSCOPY (EGD) WITH PROPOFOL N/A 06/07/2020   Procedure: ESOPHAGOGASTRODUODENOSCOPY (EGD) WITH PROPOFOL;  Surgeon: Eloise Harman, DO;  [facility];  Service: Endoscopy;  Laterality: N/A;   INGUINAL HERNIA REPAIR Left 02/14/2013   Procedure: HERNIA REPAIR INGUINAL INCARCERATED;  Surgeon: Donato Heinz, MD;  Location: [facility];  Service: General;  Laterality: Left;   INSERTION OF MESH Left 02/14/2013   Procedure: INSERTION OF MESH;  Surgeon: Donato Heinz, MD;  Location: [facility];  Service: General;  Laterality: Left;   OMENTECTOMY N/A  02/14/2013   Procedure: PARTIAL OMENTECTOMY;  Surgeon: Donato Heinz, MD;  Location: [facility];  Service: General;  Laterality: N/A;    Family History  Problem Relation Age of Onset   Lung cancer Mother    Lung cancer Father    Lung cancer Brother     Social History   Tobacco Use   Smoking status: Former   Smokeless tobacco: Never   Tobacco comments:    quit 2005/2006  Vaping Use   Vaping Use: Never used  Substance Use Topics   Alcohol use: Yes    Comment: occas   Drug use: No    Medications: I have reviewed the patient's current medications. Prior to Admission: (Not in a hospital admission)  Scheduled: Continuous:  [START ON 09/22/2021] piperacillin-tazobactam (ZOSYN)  IV     [id number] (DILAUDID) injection  No Known Allergies   ROS:  A comprehensive review of systems was negative except for: Gastrointestinal: positive for rectal pain and constipation Genitourinary: positive for retention  Blood pressure (!) 107/52, pulse 66, temperature 97.8 F (36.6 C), temperature source Oral, resp. rate 18, height 5\' 10"  (1.778 m), weight 79.4 kg, SpO2 96 %. Physical Exam Vitals reviewed.  Constitutional:      Appearance: Normal appearance.  HENT:     Head: Normocephalic.     Mouth/Throat:     Mouth: Mucous membranes are moist.  Eyes:     Extraocular Movements: Extraocular movements intact.  Cardiovascular:     Rate and Rhythm: Normal rate  and regular rhythm.  Pulmonary:     Effort: Pulmonary effort is normal.  Abdominal:     General: There is no distension.     Palpations: Abdomen is soft.     Tenderness: There is no abdominal tenderness.  Genitourinary:    Comments: Left perianal region with bulge/fluctuance, tender , DRE deferred due to pain, small external hemorrhoidal tag on the right side  Musculoskeletal:        General: Normal range of motion.     Cervical back: Normal range of motion.  Skin:    General: Skin is warm.  Neurological:     General: No  focal deficit present.     Mental Status: He is alert and oriented to person, place, and time.  Psychiatric:        Mood and Affect: Mood normal.    Results: Results for orders placed or performed during the hospital encounter of 09/21/21 (from the past 48 hour(s))  CBC with Differential     Status: Abnormal   Collection Time: 09/21/21 12:32 PM  Result Value Ref Range   WBC 13.0 (H) 4.0 - 10.5 K/uL   RBC 4.50 4.22 - 5.81 MIL/uL   Hemoglobin 14.4 13.0 - 17.0 g/dL   HCT 41.1 39.0 - 52.0 %   MCV 91.3 80.0 - 100.0 fL   MCH 32.0 26.0 - 34.0 pg   MCHC 35.0 30.0 - 36.0 g/dL   RDW 12.3 11.5 - 15.5 %   Platelets 146 (L) 150 - 400 K/uL   nRBC 0.0 0.0 - 0.2 %   Neutrophils Relative % 83 %   Neutro Abs 10.6 (H) 1.7 - 7.7 K/uL   Lymphocytes Relative 10 %   Lymphs Abs 1.4 0.7 - 4.0 K/uL   Monocytes Relative 6 %   Monocytes Absolute 0.8 0.1 - 1.0 K/uL   Eosinophils Relative 1 %   Eosinophils Absolute 0.1 0.0 - 0.5 K/uL   Basophils Relative 0 %   Basophils Absolute 0.0 0.0 - 0.1 K/uL   Immature Granulocytes 0 %   Abs Immature Granulocytes 0.04 0.00 - 0.07 K/uL    Comment: Performed at [facility], [address] [place], [place] [id number]  Basic metabolic panel     Status: Abnormal   Collection Time: 09/21/21 12:32 PM  Result Value Ref Range   Sodium 134 (L) 135 - 145 mmol/L   Potassium 3.9 3.5 - 5.1 mmol/L    Comment: NO VISIBLE HEMOLYSIS DELTA CHECK NOTED    Chloride 100 98 - 111 mmol/L   CO2 27 22 - 32 mmol/L   Glucose, Bld 105 (H) 70 - 99 mg/dL    Comment: Glucose reference range applies only to samples taken after fasting for at least 8 hours.   BUN 13 6 - 20 mg/dL   Creatinine, Ser 0.79 0.61 - 1.24 mg/dL   Calcium 9.0 8.9 - 10.3 mg/dL   GFR, Estimated >60 >60 mL/min    Comment: (NOTE) Calculated using the CKD-EPI Creatinine Equation (2021)    Anion gap 7 5 - 15    Comment: Performed at [facility], [address], [place], [place] [address]  Urinalysis, Routine w reflex  microscopic     Status: Abnormal   Collection Time: 09/21/21  1:15 PM  Result Value Ref Range   Color, Urine YELLOW YELLOW   APPearance CLEAR CLEAR   Specific Gravity, Urine 1.012 1.005 - 1.030   pH 6.0 5.0 - 8.0   Glucose, UA NEGATIVE NEGATIVE mg/dL  Hgb urine dipstick NEGATIVE NEGATIVE   Bilirubin Urine NEGATIVE NEGATIVE   Ketones, ur NEGATIVE NEGATIVE mg/dL   Protein, ur NEGATIVE NEGATIVE mg/dL   Nitrite NEGATIVE NEGATIVE   Leukocytes,Ua SMALL (A) NEGATIVE   RBC / HPF 0-5 0 - 5 RBC/hpf   WBC, UA 6-10 0 - 5 WBC/hpf   Bacteria, UA RARE (A) NONE SEEN   Squamous Epithelial / LPF 0-5 0 - 5   Mucus PRESENT     Comment: Performed at [facility], [address] [place], [place] [address]   Personally reviewed CT- perianal abscess about 3cm, possible fistula  DG Abdomen 1 View  Result Date: 09/21/2021 CLINICAL DATA:  Constipation.  Rectal pain. EXAM: ABDOMEN - 1 VIEW COMPARISON:  05/21/2020 FINDINGS: The bowel gas pattern is normal. No radiographic signs of constipation. Several right pelvic phleboliths remains stable. No radio-opaque calculi or other significant radiographic abnormality are seen. IMPRESSION: Negative. Electronically Signed   By: Marlaine Hind M.D.   On: 09/21/2021 13:12   CT ABDOMEN PELVIS W CONTRAST  Result Date: 09/21/2021 CLINICAL DATA:  Rectal pain and constipation. EXAM: CT ABDOMEN AND PELVIS WITH CONTRAST TECHNIQUE: Multidetector CT imaging of the abdomen and pelvis was performed using the standard protocol following bolus administration of intravenous contrast. RADIATION DOSE REDUCTION: This exam was performed according to the departmental dose-optimization program which includes automated exposure control, adjustment of the mA and/or kV according to patient size and/or use of iterative reconstruction technique. CONTRAST:  127mL OMNIPAQUE IOHEXOL 300 MG/ML  SOLN COMPARISON:  CT scan 05/06/2020 FINDINGS: Lower chest: The lung bases are clear of acute process. No pleural  effusion or pulmonary lesions. The heart is normal in size. No pericardial effusion. The distal esophagus and aorta are unremarkable. Hepatobiliary: No hepatic lesions or intrahepatic biliary dilatation. The gallbladder is unremarkable. No common bile duct dilatation. Pancreas: No mass, inflammation or ductal dilatation. Spleen: Normal size.  No focal lesions. Adrenals/Urinary Tract: The adrenal glands are normal. No renal lesions, hydronephrosis or renal calculi. No findings suspicious for pyelonephritis. The bladder contains a Foley catheter and is decompressed. Stomach/Bowel: Stomach, duodenum, small bowel and colon are unremarkable. No acute inflammatory changes, mass lesions or obstructive findings. The terminal ileum and appendix are normal. Moderate sigmoid colon diverticulosis without findings for acute diverticulitis. Vascular/Lymphatic: Scattered atherosclerotic calcifications involving the aorta but no aneurysm or dissection. The branch vessels are patent. The major venous structures are patent. No mesenteric or retroperitoneal mass or adenopathy the. Reproductive: The prostate gland and seminal vesicles are unremarkable. 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 MRI pelvis without and with contrast using perianal fistula protocol may be helpful for further evaluation if clinically necessary or for pre-surgical planning. The abscess measures approximately 2.6 x 2.1 cm. Musculoskeletal: No significant bony findings. IMPRESSION: 1. 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 MRI pelvis without and with contrast using perianal fistula protocol may be helpful for further evaluation if clinically necessary or for pre-surgical planning. 2. No acute abdominal/pelvic findings and no mass lesions or adenopathy. 3. Aortic atherosclerosis. Aortic Atherosclerosis (ICD10-I70.0). Electronically Signed   By: Marijo Sanes  M.D.   On: 09/21/2021 15:15     Assessment & Plan:  Todd Reilly is a 56 y.o. male with perianal abscess and possible fistula. Denies any Crohn's or IBD. He has never had a perianal abscess before.   -Discussed exam under  anesthesia, risk of bleeding, infection, needing a drain or seton, possible incision and drainage, fistulotomy versus seton, needing more surgery, injury to the sphincter complex  -Dilaudid ordered for pain -IV antibiotics -NPO midnight   All questions were answered to the satisfaction of the patient.  Appreciate Dr. Denton Brick helping with admission.    Virl Cagey 09/21/2021, 6:30 PM

## 2021-09-21 NOTE — Progress Notes (Signed)
Pharmacy Antibiotic Note  Todd SCARCELLA is a 56 y.o. male admitted on 09/21/2021 with perianal abscess.  Pharmacy has been consulted for Zosyn dosing.  WBC 13K, afebrile; Scr 0.79, CrCl 107.7 ml/min  Plan: Zosyn 3.375 gm IV Q 8 hrs (extended infusion) Monitor WBC, temp, clinical course, renal function  Height: 5\' 10"  (177.8 cm) Weight: 79.4 kg (175 lb) IBW/kg (Calculated) : 73  Temp (24hrs), Avg:97.8 F (36.6 C), Min:97.8 F (36.6 C), Max:97.8 F (36.6 C)  Recent Labs  Lab 09/20/21 0653 09/21/21 1232  WBC 11.4* 13.0*  CREATININE 0.89 0.79    Estimated Creatinine Clearance: 107.7 mL/min (by C-G formula based on SCr of 0.79 mg/dL).    No Known Allergies  Antimicrobials this admission: Zosyn 2/22 >>  Microbiology results: No cultures yet during this admission  Thank you for allowing pharmacy to be a part of this patients care.  Gillermina Hu, PharmD, BCPS, [facility] Clinical Pharmacist 09/21/2021 6:46 PM

## 2021-09-21 NOTE — Plan of Care (Signed)
  Problem: Clinical Measurements: Goal: Ability to maintain clinical measurements within normal limits will improve Outcome: Progressing Goal: Will remain free from infection Outcome: Progressing Goal: Diagnostic test results will improve Outcome: Progressing Goal: Respiratory complications will improve Outcome: Progressing Goal: Cardiovascular complication will be avoided Outcome: Progressing   Problem: Elimination: Goal: Will not experience complications related to bowel motility Outcome: Progressing Goal: Will not experience complications related to urinary retention Outcome: Progressing   Problem: Pain Managment: Goal: General experience of comfort will improve Outcome: Progressing   

## 2021-09-21 NOTE — Telephone Encounter (Signed)
Patient is certainly happy to seek GI care elsewhere if that is what he wants.  We have not seen him in over a year.  He did not follow-up after his procedures.  He needs office visit prior to [place] making further recommendations.  Thank you

## 2021-09-21 NOTE — Assessment & Plan Note (Addendum)
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  Foley catheter placed with about 1.4 L of urine drained -Continue IV Zosyn;  Add vanc pending culture data General surgery consult appreciated -09/22/21--I&D of perianal abscess--follow intra-op cultures Continue IV Dilaudid Sitz baths post-op D/c home with amox/clav+doxy x 7 more days

## 2021-09-21 NOTE — Progress Notes (Addendum)
[facility]  Plan for exam under anesthesia, I&D of the abscess and fistulotomy versus seton tomorrow.  Hospitalist admission  Antibiotics. NPO midnight Will see patient in AM Will get consent in the AM   Algis Greenhouse, MD [facility] [address] [place], [place] [address] [phone] (office)

## 2021-09-21 NOTE — Progress Notes (Signed)
[facility]  Incidental covid positive. OR must wait until the end of the scheduled day to do the case. Updated team.   Curlene Labrum, MD [facility] [address] [place], [place] [address] [phone] (office)

## 2021-09-21 NOTE — Telephone Encounter (Signed)
PATIENT SISTER CALLED AND SAID PATIENT IS AT THE ER AGAIN BECAUSE HE CAN NOT POOP. HE ALSO HAS A LARGE KNOT ON HIS BACKSIDE AND NOW CAN NOT URINATE.  I TOLD HER WE HAD HIM ON A CANCELLATION LIST AND THERE WAS ALWAYS GI COVERAGE AT THE HOSPITAL.  SHE SAID HE WOULD WIND UP GOING TO  BECAUSE "THEY KNOW WHAT THEY ARE DOING"

## 2021-09-22 ENCOUNTER — Encounter (HOSPITAL_COMMUNITY): Payer: Self-pay | Admitting: Internal Medicine

## 2021-09-22 ENCOUNTER — Other Ambulatory Visit: Payer: Self-pay

## 2021-09-22 ENCOUNTER — Inpatient Hospital Stay (HOSPITAL_COMMUNITY): Payer: Self-pay | Admitting: Certified Registered"

## 2021-09-22 ENCOUNTER — Encounter (HOSPITAL_COMMUNITY)
Admission: EM | Disposition: A | Discharge status: Home / Self Care | Payer: Self-pay | Source: Home / Self Care | Attending: Internal Medicine

## 2021-09-22 DIAGNOSIS — K610 Anal abscess: Secondary | ICD-10-CM

## 2021-09-22 DIAGNOSIS — D696 Thrombocytopenia, unspecified: Secondary | ICD-10-CM

## 2021-09-22 DIAGNOSIS — K603 Anal fistula: Secondary | ICD-10-CM

## 2021-09-22 DIAGNOSIS — K219 Gastro-esophageal reflux disease without esophagitis: Secondary | ICD-10-CM

## 2021-09-22 DIAGNOSIS — U071 COVID-19: Secondary | ICD-10-CM

## 2021-09-22 DIAGNOSIS — K644 Residual hemorrhoidal skin tags: Secondary | ICD-10-CM

## 2021-09-22 DIAGNOSIS — F419 Anxiety disorder, unspecified: Secondary | ICD-10-CM

## 2021-09-22 HISTORY — PX: HEMORRHOID SURGERY: SHX153

## 2021-09-22 HISTORY — PX: INCISION AND DRAINAGE PERIRECTAL ABSCESS: SHX1804

## 2021-09-22 LAB — COMPREHENSIVE METABOLIC PANEL
ALT: 14 U/L (ref 0–44)
AST: 14 U/L — ABNORMAL LOW (ref 15–41)
Albumin: 3.6 g/dL (ref 3.5–5.0)
Alkaline Phosphatase: 50 U/L (ref 38–126)
Anion gap: 6 (ref 5–15)
BUN: 11 mg/dL (ref 6–20)
CO2: 29 mmol/L (ref 22–32)
Calcium: 8.8 mg/dL — ABNORMAL LOW (ref 8.9–10.3)
Chloride: 102 mmol/L (ref 98–111)
Creatinine, Ser: 0.98 mg/dL (ref 0.61–1.24)
GFR, Estimated: >60 mL/min (ref >60)
Glucose, Bld: 116 mg/dL — ABNORMAL HIGH (ref 70–99)
Potassium: 4.8 mmol/L (ref 3.5–5.1)
Sodium: 137 mmol/L (ref 135–145)
Total Bilirubin: 1.8 mg/dL — ABNORMAL HIGH (ref 0.3–1.2)
Total Protein: 7.3 g/dL (ref 6.5–8.1)

## 2021-09-22 LAB — SURGICAL PCR SCREEN
MRSA, PCR: NEGATIVE
Staphylococcus aureus: NEGATIVE

## 2021-09-22 LAB — CBC
HCT: 41.2 % (ref 39.0–52.0)
Hemoglobin: 13.8 g/dL (ref 13.0–17.0)
MCH: 30.2 pg (ref 26.0–34.0)
MCHC: 33.5 g/dL (ref 30.0–36.0)
MCV: 90.2 fL (ref 80.0–100.0)
Platelets: 147 10*3/uL — ABNORMAL LOW (ref 150–400)
RBC: 4.57 MIL/uL (ref 4.22–5.81)
RDW: 12 % (ref 11.5–15.5)
WBC: 10.9 10*3/uL — ABNORMAL HIGH (ref 4.0–10.5)
nRBC: 0 % (ref 0.0–0.2)

## 2021-09-22 LAB — FERRITIN: Ferritin: 103 ng/mL (ref 24–336)

## 2021-09-22 LAB — HIV ANTIBODY (ROUTINE TESTING W REFLEX): HIV Screen 4th Generation wRfx: NONREACTIVE

## 2021-09-22 LAB — TSH: TSH: 2.431 u[IU]/mL (ref 0.350–4.500)

## 2021-09-22 LAB — C-REACTIVE PROTEIN
CRP: 1.7 mg/dL — ABNORMAL HIGH (ref <1.0)
CRP: 2.6 mg/dL — ABNORMAL HIGH (ref <1.0)

## 2021-09-22 LAB — D-DIMER, QUANTITATIVE: D-Dimer, Quant: 0.88 ug/mL-FEU — ABNORMAL HIGH (ref 0.00–0.50)

## 2021-09-22 LAB — VITAMIN B12: Vitamin B-12: 339 pg/mL (ref 180–914)

## 2021-09-22 LAB — MAGNESIUM: Magnesium: 2.1 mg/dL (ref 1.7–2.4)

## 2021-09-22 LAB — T4, FREE: Free T4: 0.93 ng/dL (ref 0.61–1.12)

## 2021-09-22 LAB — FOLATE: Folate: 11.8 ng/mL (ref >5.9)

## 2021-09-22 LAB — PHOSPHORUS: Phosphorus: 3.3 mg/dL (ref 2.5–4.6)

## 2021-09-22 SURGERY — INCISION AND DRAINAGE, ABSCESS, PERIRECTAL
Anesthesia: General | Site: Anus

## 2021-09-22 MED ORDER — CHLORHEXIDINE GLUCONATE CLOTH 2 % EX PADS: 6.0000 | MEDICATED_PAD | Freq: Once | CUTANEOUS | Status: DC

## 2021-09-22 MED ORDER — CHLORHEXIDINE GLUCONATE CLOTH 2 % EX PADS
6.0000 | MEDICATED_PAD | Freq: Every day | CUTANEOUS | Status: DC
  Administered 2021-09-22 – 2021-09-24 (×3): 6 via TOPICAL

## 2021-09-22 MED ORDER — BUPIVACAINE LIPOSOME 1.3 % IJ SUSP
INTRAMUSCULAR | Status: AC
  Filled 2021-09-22: qty 20

## 2021-09-22 MED ORDER — BUPIVACAINE LIPOSOME 1.3 % IJ SUSP
INTRAMUSCULAR | Status: DC | PRN
  Administered 2021-09-22: 20 mL

## 2021-09-22 MED ORDER — FENTANYL CITRATE (PF) 250 MCG/5ML IJ SOLN
INTRAMUSCULAR | Status: AC
  Filled 2021-09-22: qty 5

## 2021-09-22 MED ORDER — LIDOCAINE HCL (PF) 2 % IJ SOLN
INTRAMUSCULAR | Status: AC
  Filled 2021-09-22: qty 5

## 2021-09-22 MED ORDER — DOCUSATE SODIUM 100 MG PO CAPS
100.0000 mg | ORAL_CAPSULE | Freq: Two times a day (BID) | ORAL | Status: DC
  Administered 2021-09-22 – 2021-09-24 (×5): 100 mg via ORAL
  Filled 2021-09-22 (×5): qty 1

## 2021-09-22 MED ORDER — MIDAZOLAM HCL 2 MG/2ML IJ SOLN
INTRAMUSCULAR | Status: DC | PRN
  Administered 2021-09-22: 2 mg via INTRAVENOUS

## 2021-09-22 MED ORDER — ONDANSETRON HCL 4 MG/2ML IJ SOLN: 4.0000 mg | Freq: Once | INTRAMUSCULAR | Status: DC | PRN

## 2021-09-22 MED ORDER — LACTATED RINGERS IV SOLN: INTRAVENOUS | Status: DC | PRN

## 2021-09-22 MED ORDER — ONDANSETRON HCL 4 MG/2ML IJ SOLN
INTRAMUSCULAR | Status: AC
  Filled 2021-09-22: qty 2

## 2021-09-22 MED ORDER — 0.9 % SODIUM CHLORIDE (POUR BTL) OPTIME
TOPICAL | Status: DC | PRN
Start: 2021-09-22 — End: 2021-09-22
  Administered 2021-09-22: 1000 mL

## 2021-09-22 MED ORDER — HYDROMORPHONE HCL 1 MG/ML IJ SOLN
INTRAMUSCULAR | Status: AC
  Filled 2021-09-22: qty 1

## 2021-09-22 MED ORDER — FENTANYL CITRATE (PF) 250 MCG/5ML IJ SOLN
INTRAMUSCULAR | Status: DC | PRN
  Administered 2021-09-22 (×2): 100 ug via INTRAVENOUS
  Administered 2021-09-22: 50 ug via INTRAVENOUS

## 2021-09-22 MED ORDER — LIDOCAINE VISCOUS HCL 2 % MT SOLN
OROMUCOSAL | Status: DC | PRN
  Administered 2021-09-22: 20 mL via OROMUCOSAL

## 2021-09-22 MED ORDER — MEPERIDINE HCL 50 MG/ML IJ SOLN: 6.2500 mg | INTRAMUSCULAR | Status: DC | PRN

## 2021-09-22 MED ORDER — METHYLENE BLUE 0.5 % INJ SOLN
INTRAVENOUS | Status: AC
  Filled 2021-09-22: qty 10

## 2021-09-22 MED ORDER — ALPRAZOLAM 1 MG PO TABS: 1.0000 mg | ORAL_TABLET | Freq: Three times a day (TID) | ORAL | Status: DC

## 2021-09-22 MED ORDER — OXYCODONE HCL 5 MG PO TABS
5.0000 mg | ORAL_TABLET | ORAL | Status: DC | PRN
  Administered 2021-09-22 – 2021-09-24 (×7): 10 mg via ORAL
  Filled 2021-09-22 (×7): qty 2

## 2021-09-22 MED ORDER — PROPOFOL 10 MG/ML IV BOLUS
INTRAVENOUS | Status: AC
  Filled 2021-09-22: qty 20

## 2021-09-22 MED ORDER — MOLNUPIRAVIR EUA 200MG CAPSULE
4.0000 | ORAL_CAPSULE | Freq: Two times a day (BID) | ORAL | Status: DC
  Administered 2021-09-22 – 2021-09-24 (×4): 800 mg via ORAL
  Filled 2021-09-22: qty 4

## 2021-09-22 MED ORDER — ONDANSETRON HCL 4 MG/2ML IJ SOLN
INTRAMUSCULAR | Status: DC | PRN
  Administered 2021-09-22: 4 mg via INTRAVENOUS

## 2021-09-22 MED ORDER — MIDAZOLAM HCL 2 MG/2ML IJ SOLN
INTRAMUSCULAR | Status: AC
  Filled 2021-09-22: qty 2

## 2021-09-22 MED ORDER — HYDROMORPHONE HCL 1 MG/ML IJ SOLN
0.2500 mg | INTRAMUSCULAR | Status: DC | PRN
  Administered 2021-09-22: 0.5 mg via INTRAVENOUS

## 2021-09-22 MED ORDER — DEXAMETHASONE SODIUM PHOSPHATE 10 MG/ML IJ SOLN
INTRAMUSCULAR | Status: AC
  Filled 2021-09-22: qty 1

## 2021-09-22 MED ORDER — ALPRAZOLAM 0.5 MG PO TABS
0.5000 mg | ORAL_TABLET | Freq: Three times a day (TID) | ORAL | Status: DC
  Administered 2021-09-22 – 2021-09-24 (×7): 0.5 mg via ORAL
  Filled 2021-09-22 (×7): qty 1

## 2021-09-22 MED ORDER — HYDROMORPHONE HCL 1 MG/ML IJ SOLN
1.0000 mg | Freq: Once | INTRAMUSCULAR | Status: AC
  Administered 2021-09-22: 1 mg via INTRAVENOUS
  Filled 2021-09-22: qty 1

## 2021-09-22 MED ORDER — PROPOFOL 10 MG/ML IV BOLUS
INTRAVENOUS | Status: DC | PRN
Start: 2021-09-22 — End: 2021-09-22
  Administered 2021-09-22: 300 mg via INTRAVENOUS

## 2021-09-22 MED ORDER — HYDROMORPHONE HCL 1 MG/ML IJ SOLN
1.0000 mg | INTRAMUSCULAR | Status: DC | PRN
  Administered 2021-09-22 – 2021-09-23 (×4): 1 mg via INTRAVENOUS
  Filled 2021-09-22 (×4): qty 1

## 2021-09-22 MED ORDER — LIDOCAINE HCL (CARDIAC) PF 100 MG/5ML IV SOSY
PREFILLED_SYRINGE | INTRAVENOUS | Status: DC | PRN
  Administered 2021-09-22: 40 mg via INTRAVENOUS

## 2021-09-22 MED ORDER — LIDOCAINE VISCOUS HCL 2 % MT SOLN
OROMUCOSAL | Status: AC
  Filled 2021-09-22: qty 15

## 2021-09-22 MED ORDER — DEXAMETHASONE SODIUM PHOSPHATE 10 MG/ML IJ SOLN
INTRAMUSCULAR | Status: DC | PRN
  Administered 2021-09-22: 10 mg via INTRAVENOUS

## 2021-09-22 MED ORDER — DEXMEDETOMIDINE (PRECEDEX) IN NS 20 MCG/5ML (4 MCG/ML) IV SYRINGE
PREFILLED_SYRINGE | INTRAVENOUS | Status: DC | PRN
  Administered 2021-09-22: 8 ug via INTRAVENOUS
  Administered 2021-09-22: 12 ug via INTRAVENOUS

## 2021-09-22 SURGICAL SUPPLY — 22 items
BAG HAMPER (MISCELLANEOUS) ×3 IMPLANT
CLOTH BEACON ORANGE TIMEOUT ST (SAFETY) ×3 IMPLANT
COVER LIGHT HANDLE STERIS (MISCELLANEOUS) ×6 IMPLANT
DRAIN PENROSE 12X.25 LTX STRL (MISCELLANEOUS) ×1 IMPLANT
DRAPE HALF SHEET 40X57 (DRAPES) ×3 IMPLANT
ELECT REM PT RETURN 9FT ADLT (ELECTROSURGICAL) ×3
ELECTRODE REM PT RTRN 9FT ADLT (ELECTROSURGICAL) ×2 IMPLANT
GAUZE SPONGE 4X4 12PLY STRL (GAUZE/BANDAGES/DRESSINGS) ×5 IMPLANT
GLOVE SURG ENC MOIS LTX SZ6.5 (GLOVE) ×3 IMPLANT
GLOVE SURG UNDER POLY LF SZ7 (GLOVE) ×9 IMPLANT
GOWN STRL REUS W/TWL LRG LVL3 (GOWN DISPOSABLE) ×6 IMPLANT
KIT BLADEGUARD II DBL (SET/KITS/TRAYS/PACK) ×1 IMPLANT
KIT TURNOVER KIT A (KITS) ×3 IMPLANT
MANIFOLD NEPTUNE II (INSTRUMENTS) ×3 IMPLANT
NS IRRIG 1000ML POUR BTL (IV SOLUTION) ×3 IMPLANT
PACK MINOR (CUSTOM PROCEDURE TRAY) ×3 IMPLANT
PAD ABD 5X9 TENDERSORB (GAUZE/BANDAGES/DRESSINGS) ×1 IMPLANT
PAD ARMBOARD 7.5X6 YLW CONV (MISCELLANEOUS) ×3 IMPLANT
SET BASIN LINEN APH (SET/KITS/TRAYS/PACK) ×3 IMPLANT
SHEET LAVH (DRAPES) ×1 IMPLANT
SYR 20ML LL LF (SYRINGE) ×1 IMPLANT
YANKAUER SUCT BULB TIP 10FT TU (MISCELLANEOUS) ×1 IMPLANT

## 2021-09-22 NOTE — Interval H&P Note (Signed)
History and Physical Interval Note:  09/22/2021 10:29 AM  Todd Reilly  has presented today for surgery, with the diagnosis of perianal abscess possible fistula.  The various methods of treatment have been discussed with the patient and family. After consideration of risks, benefits and other options for treatment, the patient has consented to  Procedure(s): IRRIGATION AND DEBRIDEMENT PERIRECTAL ABSCESS, possible seton (N/A) as a surgical intervention.  The patient's history has been reviewed, patient examined, no change in status, stable for surgery.  I have reviewed the patient's chart and labs.  Questions were answered to the patient's satisfaction.     Lucretia Roers

## 2021-09-22 NOTE — Progress Notes (Signed)
[facility]  Updated sister and team. Perianal abscess drained. Superficial connection to his right hemorrhoidal tag, so it was removed.   Gel foam gauze in his anus will be defecated or will fall out.  Sitz baths q 4 hour and after Bms Colace Regular diet Cultures sent IV zosyn  Will remove packing tomorrow   Curlene Labrum, MD [facility] [place], [place] [address] [phone] (office)

## 2021-09-22 NOTE — Assessment & Plan Note (Signed)
Continue alprazolam Reduce dose temporarily due to interaction with ritonavir PDMP reviewed--receives xanax 1 mg, #90 monthly

## 2021-09-22 NOTE — Anesthesia Procedure Notes (Signed)
Procedure Name: LMA Insertion Date/Time: 09/22/2021 10:48 AM Performed by: Lorin Glass, CRNA Pre-anesthesia Checklist: Patient identified, Emergency Drugs available, Suction available and Patient being monitored Patient Re-evaluated:Patient Re-evaluated prior to induction Oxygen Delivery Method: Circle system utilized Preoxygenation: Pre-oxygenation with 100% oxygen Induction Type: IV induction LMA: LMA inserted LMA Size: 4.0 Number of attempts: 1 Placement Confirmation: breath sounds checked- equal and bilateral and positive ETCO2 Tube secured with: Tape

## 2021-09-22 NOTE — Op Note (Signed)
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 Henreitta Leber, MD   Assistants: No qualified resident was available    Anesthesia: General endotracheal   Anesthesiologist: Dr. Pilar Plate, MD    Specimens: Right posterior hemorrhoidal tag, cultures    Estimated Blood Loss: Minimal   Blood Replacement: None    Complications: None    Wound Class: Dirty, infected    Operative Indications: Todd Reilly is a 56 yo with a perianal abscess and concern for fistula. We discussed incision and drainage and possible seton versus fistulotomy. We discussed the risk of bleeding, infection, needing more surgery or sphincter injury.   Findings: Left posterior perianal abscess, superficial fistula to the right posterior hemorrhoid skin tag    Procedure: Following all COVID protocols due to his incidental COVID status, the patient was taken to the operating room and placed supine. General endotracheal anesthesia was induced. Intravenous antibiotics were not administered as cultures were planned.  He was then placed in lithotomy with all pressure points padded.  The peritoneal region and anus were prepared and draped in the usual sterile fashion.   A fluctuant area was noted in the left posterior perianal region and no drainage. The digital rectal exam was benign. No obvious internal findings were noted. A right posterior hemorrhoidal tag was noted. The abscess was injected with peroxide while an anoscope was inserted. No peroxide was noted internally. The right posterior hemorrhoidal tag did however become puffy from a likely communication. The abscess was opened and purulence was  expressed, cultures were obtained. The cavity was palpated and was about 2cm in size and did track superficially to the right posterior skin tag.  This external hemorrhoidal tag was excised with cautery.   Hemostasis was achieved.  A gelfoam roll with viscous lidocaine was inserted into the anus. A 1/4 inch penrose was placed into the I&D cavity and secured with 3-0 Nylon. Gauze packing was placed. An ABD and mesh underwear was placed.   Final inspection revealed acceptable hemostasis. All counts were correct at the end of the case. The patient was awakened from anesthesia and extubated without complication.  The patient went to the floor per COVID protocol after recovering. The RN on the floor was made aware of the gelfoam that will be defecated or will fall out.    Todd Greenhouse, MD [facility] [address], [place] [address] [phone] (office)

## 2021-09-22 NOTE — Assessment & Plan Note (Addendum)
Has been chronic dating back to Nov 2013 Monitor for signs of bleeding Serum B12--339 Folate--11.8 TSH--2.431 No hepatosplenomegaly on CT abd Overall stable through the hospitalization

## 2021-09-22 NOTE — Transfer of Care (Signed)
Immediate Anesthesia Transfer of Care Note  Patient: Todd Reilly  Procedure(s) Performed: IRRIGATION OF PERI-ANAL ABCESS, EXCISION OF HEMROIDAL SKIN TAG, PENROSE PLACEMENT (Anus)  Patient Location: PACU  Anesthesia Type:General  Level of Consciousness: awake  Airway & Oxygen Therapy: Patient Spontanous Breathing and Patient connected to nasal cannula oxygen  Post-op Assessment: Report given to RN and Post -op Vital signs reviewed and stable  Post vital signs: Reviewed and stable  Last Vitals:  Vitals Value Taken Time  BP 128/74   Temp    Pulse 92   Resp    SpO2 98%     Last Pain:  Vitals:   09/22/21 1000  TempSrc:   PainSc: 6          Complications: No notable events documented.

## 2021-09-22 NOTE — Anesthesia Preprocedure Evaluation (Addendum)
Anesthesia Evaluation  Patient identified by MRN, date of birth, ID band Patient awake    Reviewed: Allergy & Precautions, NPO status , Patient's Chart, lab work & pertinent test results  Airway Mallampati: II  TM Distance: >3 FB Neck ROM: Full    Dental  (+) Chipped, Dental Advisory Given   Pulmonary neg pulmonary ROS, former smoker,    Pulmonary exam normal breath sounds clear to auscultation       Cardiovascular negative cardio ROS Normal cardiovascular exam Rhythm:Regular Rate:Normal     Neuro/Psych PSYCHIATRIC DISORDERS Anxiety negative neurological ROS     GI/Hepatic Neg liver ROS, GERD  Medicated,  Endo/Other  negative endocrine ROS  Renal/GU negative Renal ROS  negative genitourinary   Musculoskeletal negative musculoskeletal ROS (+)   Abdominal   Peds negative pediatric ROS (+)  Hematology negative hematology ROS (+)   Anesthesia Other Findings   Reproductive/Obstetrics negative OB ROS                             Anesthesia Physical Anesthesia Plan  ASA: 2 and emergent  Anesthesia Plan: General   Post-op Pain Management: Dilaudid IV   Induction: Intravenous  PONV Risk Score and Plan: 3 and Ondansetron, Dexamethasone, Midazolam and Metaclopromide  Airway Management Planned: LMA  Additional Equipment:   Intra-op Plan:   Post-operative Plan: Extubation in OR  Informed Consent: I have reviewed the patients History and Physical, chart, labs and discussed the procedure including the risks, benefits and alternatives for the proposed anesthesia with the patient or authorized representative who has indicated his/her understanding and acceptance.     Dental advisory given  Plan Discussed with: CRNA and Surgeon  Anesthesia Plan Comments:        Anesthesia Quick Evaluation

## 2021-09-22 NOTE — Progress Notes (Addendum)
PROGRESS NOTE  Todd Reilly [id number] DOB: Nov 06, 1965 DOA: 09/21/2021 PCP: Redmond School, MD  Brief History:  56 year old male with a history of anxiety and constipation presenting with rectal pain that began on 09/18/2021.  The patient states that he has been struggling with constipation.  He was straining to have a bowel movement.  He subsequently had an episode of nonbilious nonbloody vomiting.  Since that time, the patient has had worsening rectal pain.  He went to the emergency department on 09/20/2021.  He was felt to have had hemorrhoids.  He was discharged in stable condition.  He continued to have worsening rectal pain and difficulty urinating.  He stated he took a whole bottle of MiraLAX on the morning of admission without a bowel movement.  As result, he presented for further evaluation.  The patient denied any fevers, chills, headache, chest pain, shortness breath, cough, hemoptysis.  He has not had any hematochezia or melena.  There is no hematemesis. In the ED, the patient was afebrile and hemodynamically stable with oxygen saturation 99% room air.  BMP showed sodium 134, potassium 3.9, bicarbonate 27, serum creatinine 0.79.  WBC 13.0, hemoglobin 14.4, platelets 146,000.  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  The patient was started on Zosyn.  General surgery was consulted to assist with management.    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  Foley catheter placed with about 1.4 L of urine drained -IV Zosyn General surgery consult>> planning for OR debridement and evaluation Continue IV Dilaudid Patient deferred rectal/GU exam due to pain  Anxiety Continue alprazolam Reduce dose  temporarily due to interaction with ritonavir PDMP reviewed--receives xanax 1 mg, #90 monthly  GERD (gastroesophageal reflux disease) Restart PPI  Thrombocytopenia (HCC) Has been chronic dating back to Nov 2013 Monitor for signs of bleeding Serum B12 Folate TSH No hepatosplenomegaly on CT abd  COVID-19 virus infection- (present on admission) Incidentally positive.  Fully vaccinated.  No symptoms. -personally reviewed--no infiltrates or edema -Paxlovid started -Ferritin 71>>103 -D-dimer 0.97>> 0.88 -CRP--pending         Status is: Inpatient Remains inpatient appropriate because: severity of illness requiring IV antibiotics and surgical debridement            Family Communication:  no Family at bedside  Consultants:  general surgery  Code Status:  FULL   DVT Prophylaxis:  SCDs   Procedures: As Listed in Progress Note Above  Antibiotics: Zosyn 2/22>>      Subjective: Patient complains of rectal pain.  Denies f/c, cp, sob, n/v/d, abd pain  Objective: Vitals:   09/21/21 2007 09/21/21 2101 09/21/21 2107 09/22/21 0500  BP: (!) 140/94 126/75  126/75  Pulse: 85 75  88  Resp: 16 19  18   Temp:  98.3 F (36.8 C)  98.7 F (37.1 C)  TempSrc:  Oral    SpO2: 100% 99%  99%  Weight:   84.3 kg   Height:   5\' 10"  (1.778 m)     Intake/Output Summary (Last 24 hours) at 09/22/2021 0905 Last data filed at 09/22/2021 0500 Gross per 24 hour  Intake 778.34 ml  Output 2450 ml  Net -1671.66  ml   Weight change:  Exam:  General:  Pt is alert, follows commands appropriately, not in acute distress HEENT: No icterus, No thrush, No neck mass, [place]/AT Cardiovascular: RRR, S1/S2, no rubs, no gallops Respiratory: CTA bilaterally, no wheezing, no crackles, no rhonchi Abdomen: Soft/+BS, non tender, non distended, no guarding Extremities: No edema, No lymphangitis, No petechiae, No rashes, no synovitis Rectal exam>>deferred by patient due to pain   Data  Reviewed: I have personally reviewed following labs and imaging studies Basic Metabolic Panel: Recent Labs  Lab 09/20/21 0653 09/21/21 1232 09/22/21 0556  NA 137 134* 137  K 4.7 3.9 4.8  CL 102 100 102  CO2 29 27 29   GLUCOSE 114* 105* 116*  BUN 14 13 11   CREATININE 0.89 0.79 0.98  CALCIUM 9.1 9.0 8.8*  MG  --   --  2.1  PHOS  --   --  3.3   Liver Function Tests: Recent Labs  Lab 09/20/21 0653 09/22/21 0556  AST 16 14*  ALT 16 14  ALKPHOS 50 50  BILITOT 1.0 1.8*  PROT 7.2 7.3  ALBUMIN 3.8 3.6   Recent Labs  Lab 09/20/21 0653  LIPASE 28   No results for input(s): AMMONIA in the last 168 hours. Coagulation Profile: No results for input(s): INR, PROTIME in the last 168 hours. CBC: Recent Labs  Lab 09/20/21 0653 09/21/21 1232 09/22/21 0556  WBC 11.4* 13.0* 10.9*  NEUTROABS 9.4* 10.6*  --   HGB 14.7 14.4 13.8  HCT 43.0 41.1 41.2  MCV 91.5 91.3 90.2  PLT 143* 146* 147*   Cardiac Enzymes: No results for input(s): CKTOTAL, CKMB, CKMBINDEX, TROPONINI in the last 168 hours. BNP: Invalid input(s): POCBNP CBG: No results for input(s): GLUCAP in the last 168 hours. HbA1C: No results for input(s): HGBA1C in the last 72 hours. Urine analysis:    Component Value Date/Time   COLORURINE YELLOW 09/21/2021 1315   APPEARANCEUR CLEAR 09/21/2021 1315   LABSPEC 1.012 09/21/2021 1315   PHURINE 6.0 09/21/2021 1315   GLUCOSEU NEGATIVE 09/21/2021 1315   HGBUR NEGATIVE 09/21/2021 1315   BILIRUBINUR NEGATIVE 09/21/2021 1315   KETONESUR NEGATIVE 09/21/2021 1315   PROTEINUR NEGATIVE 09/21/2021 1315   NITRITE NEGATIVE 09/21/2021 1315   LEUKOCYTESUR SMALL (A) 09/21/2021 1315   Sepsis Labs: @LABRCNTIP (procalcitonin:4,lacticidven:4) ) Recent Results (from the past 240 hour(s))  Resp Panel by RT-PCR (Flu A&B, Covid) Nasopharyngeal Swab     Status: Abnormal   Collection Time: 09/21/21  4:30 PM   Specimen: Nasopharyngeal Swab; Nasopharyngeal(NP) swabs in vial transport medium   Result Value Ref Range Status   SARS Coronavirus 2 by RT PCR POSITIVE (A) NEGATIVE Final    Comment: (NOTE) SARS-CoV-2 target nucleic acids are DETECTED.  The SARS-CoV-2 RNA is generally detectable in upper respiratory specimens during the acute phase of infection. Positive results are indicative of the presence of the identified virus, but do not rule out bacterial infection or co-infection with other pathogens not detected by the test. Clinical correlation with patient history and other diagnostic information is necessary to determine patient infection status. The expected result is Negative.  Fact Sheet for Patients: EntrepreneurPulse.com.au  Fact Sheet for Healthcare Providers: IncredibleEmployment.be  This test is not yet approved or cleared by the [place] FDA and  has been authorized for detection and/or diagnosis of SARS-CoV-2 by FDA under an Emergency Use Authorization (EUA).  This EUA will remain in effect (meaning this test can be used) for the duration of  the COVID-19  declaration under Section 564(b)(1) of the A ct, 21 U.S.C. section 360bbb-3(b)(1), unless the authorization is terminated or revoked sooner.     Influenza A by PCR NEGATIVE NEGATIVE Final   Influenza B by PCR NEGATIVE NEGATIVE Final    Comment: (NOTE) The Xpert Xpress SARS-CoV-2/FLU/RSV plus assay is intended as an aid in the diagnosis of influenza from Nasopharyngeal swab specimens and should not be used as a sole basis for treatment. Nasal washings and aspirates are unacceptable for Xpert Xpress SARS-CoV-2/FLU/RSV testing.  Fact Sheet for Patients: BloggerCourse.com  Fact Sheet for Healthcare Providers: SeriousBroker.it  This test is not yet approved or cleared by the [place] FDA and has been authorized for detection and/or diagnosis of SARS-CoV-2 by FDA under an Emergency Use Authorization (EUA).  This EUA will remain in effect (meaning this test can be used) for the duration of the COVID-19 declaration under Section 564(b)(1) of the Act, 21 U.S.C. section 360bbb-3(b)(1), unless the authorization is terminated or revoked.  Performed at [facility], [address], [place], [place] [address]   Surgical pcr screen     Status: None   Collection Time: 09/21/21 10:40 PM   Specimen: Nasal Mucosa; Nasal Swab  Result Value Ref Range Status   MRSA, PCR NEGATIVE NEGATIVE Final   Staphylococcus aureus NEGATIVE NEGATIVE Final    Comment: (NOTE) The Xpert SA Assay (FDA approved for NASAL specimens in patients 7 years of age and older), is one component of a comprehensive surveillance program. It is not intended to diagnose infection nor to guide or monitor treatment. Performed at [facility], [address], [place], [place] [address]      Scheduled Meds:  ALPRAZolam  0.5 mg Oral TID   Chlorhexidine Gluconate Cloth  6 each Topical Daily   molnupiravir EUA  4 capsule Oral BID   Continuous Infusions:  sodium chloride 100 mL/hr at 09/21/21 2221   piperacillin-tazobactam (ZOSYN)  IV 3.375 g (09/22/21 0828)    Procedures/Studies: DG Abdomen 1 View  Result Date: 09/21/2021 CLINICAL DATA:  Constipation.  Rectal pain. EXAM: ABDOMEN - 1 VIEW COMPARISON:  05/21/2020 FINDINGS: The bowel gas pattern is normal. No radiographic signs of constipation. Several right pelvic phleboliths remains stable. No radio-opaque calculi or other significant radiographic abnormality are seen. IMPRESSION: Negative. Electronically Signed   By: Danae Orleans M.D.   On: 09/21/2021 13:12   CT ABDOMEN PELVIS W CONTRAST  Result Date: 09/21/2021 CLINICAL DATA:  Rectal pain and constipation. EXAM: CT ABDOMEN AND PELVIS WITH CONTRAST TECHNIQUE: Multidetector CT imaging of the abdomen and pelvis was performed using the standard protocol following bolus administration of intravenous contrast. RADIATION DOSE REDUCTION: This  exam was performed according to the departmental dose-optimization program which includes automated exposure control, adjustment of the mA and/or kV according to patient size and/or use of iterative reconstruction technique. CONTRAST:  OMNIPAQUE IOHEXOL 300 MG/ML  SOLN COMPARISON:  CT scan 05/06/2020 FINDINGS: Lower chest: The lung bases are clear of acute process. No pleural effusion or pulmonary lesions. The heart is normal in size. No pericardial effusion. The distal esophagus and aorta are unremarkable. Hepatobiliary: No hepatic lesions or intrahepatic biliary dilatation. The gallbladder is unremarkable. No common bile duct dilatation. Pancreas: No mass, inflammation or ductal dilatation. Spleen: Normal size.  No focal lesions. Adrenals/Urinary Tract: The adrenal glands are normal. No renal lesions, hydronephrosis or renal calculi. No findings suspicious for pyelonephritis. The bladder contains a Foley catheter and is decompressed. Stomach/Bowel: Stomach, duodenum, small bowel and colon  are unremarkable. No acute inflammatory changes, mass lesions or obstructive findings. The terminal ileum and appendix are normal. Moderate sigmoid colon diverticulosis without findings for acute diverticulitis. Vascular/Lymphatic: Scattered atherosclerotic calcifications involving the aorta but no aneurysm or dissection. The branch vessels are patent. The major venous structures are patent. No mesenteric or retroperitoneal mass or adenopathy the. Reproductive: The prostate gland and seminal vesicles are unremarkable. 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 MRI pelvis without and with contrast using perianal fistula protocol may be helpful for further evaluation if clinically necessary or for pre-surgical planning. The abscess measures approximately 2.6 x 2.1 cm. Musculoskeletal: No significant bony findings. IMPRESSION: 1. 2.6 x 2.1 cm perianal abscess likely due to a  perianal fistula at the 5 o'clock position. MRI pelvis without and with contrast using perianal fistula protocol may be helpful for further evaluation if clinically necessary or for pre-surgical planning. 2. No acute abdominal/pelvic findings and no mass lesions or adenopathy. 3. Aortic atherosclerosis. Aortic Atherosclerosis (ICD10-I70.0). Electronically Signed   By: Marijo Sanes M.D.   On: 09/21/2021 15:15   DG CHEST PORT 1 VIEW  Result Date: 09/21/2021 CLINICAL DATA:  History of perianal abscess with COVID positivity EXAM: PORTABLE CHEST 1 VIEW COMPARISON:  07/19/2015 FINDINGS: Cardiac shadow is within normal limits. The lungs are well aerated bilaterally. No focal infiltrate or effusion is seen. No bony abnormality is noted. IMPRESSION: No active disease. Electronically Signed   By: Inez Catalina M.D.   On: 09/21/2021 22:38    Orson Eva, DO  [facility]  If 7PM-7AM, please contact night-coverage [email or web address] Password [email or web address] 09/22/2021, 9:05 AM   LOS: 1 day

## 2021-09-22 NOTE — Hospital Course (Addendum)
56 year old male with a history of anxiety and constipation presenting with rectal pain that began on 09/18/2021.  The patient states that he has been struggling with constipation.  He was straining to have a bowel movement.  He subsequently had an episode of nonbilious nonbloody vomiting.  Since that time, the patient has had worsening rectal pain.  He went to the emergency department on 09/20/2021.  He was felt to have had hemorrhoids.  He was discharged in stable condition.  He continued to have worsening rectal pain and difficulty urinating.  He stated he took a whole bottle of MiraLAX on the morning of admission without a bowel movement.  As result, he presented for further evaluation.  The patient denied any fevers, chills, headache, chest pain, shortness breath, cough, hemoptysis.  He has not had any hematochezia or melena.  There is no hematemesis. In the ED, the patient was afebrile and hemodynamically stable with oxygen saturation 99% room air.  BMP showed sodium 134, potassium 3.9, bicarbonate 27, serum creatinine 0.79.  WBC 13.0, hemoglobin 14.4, platelets 146,000.  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  The patient was started on Zosyn.  General surgery was consulted to assist with management.  Patient was taken to OR for debridement on 09/22/21.

## 2021-09-22 NOTE — Assessment & Plan Note (Signed)
Restart PPI

## 2021-09-22 NOTE — Anesthesia Postprocedure Evaluation (Signed)
Anesthesia Post Note  Patient: Todd Reilly  Procedure(s) Performed: IRRIGATION OF PERI-ANAL ABCESS, EXCISION OF HEMROIDAL SKIN TAG, PENROSE PLACEMENT (Anus)  Patient location during evaluation: PACU Anesthesia Type: General Level of consciousness: awake and alert and oriented Pain management: pain level controlled Vital Signs Assessment: post-procedure vital signs reviewed and stable Respiratory status: spontaneous breathing, nonlabored ventilation and respiratory function stable Cardiovascular status: blood pressure returned to baseline and stable Postop Assessment: no apparent nausea or vomiting Anesthetic complications: no   No notable events documented.   Last Vitals:  Vitals:   09/22/21 1149 09/22/21 1158  BP: 128/74 126/84  Pulse: (!) 101 79  Resp: 17 17  Temp: 36.9 C   SpO2: 97% 95%    Last Pain:  Vitals:   09/22/21 1149  TempSrc:   PainSc: 0-No pain                 Jennavieve Arrick C Kripa Foskey

## 2021-09-23 DIAGNOSIS — R338 Other retention of urine: Secondary | ICD-10-CM

## 2021-09-23 DIAGNOSIS — K603 Anal fistula: Secondary | ICD-10-CM

## 2021-09-23 LAB — D-DIMER, QUANTITATIVE: D-Dimer, Quant: 1.78 ug/mL-FEU — ABNORMAL HIGH (ref 0.00–0.50)

## 2021-09-23 LAB — CBC
HCT: 39.7 % (ref 39.0–52.0)
Hemoglobin: 13.7 g/dL (ref 13.0–17.0)
MCH: 30.3 pg (ref 26.0–34.0)
MCHC: 34.5 g/dL (ref 30.0–36.0)
MCV: 87.8 fL (ref 80.0–100.0)
Platelets: 151 10*3/uL (ref 150–400)
RBC: 4.52 MIL/uL (ref 4.22–5.81)
RDW: 11.6 % (ref 11.5–15.5)
WBC: 17.5 10*3/uL — ABNORMAL HIGH (ref 4.0–10.5)
nRBC: 0 % (ref 0.0–0.2)

## 2021-09-23 LAB — COMPREHENSIVE METABOLIC PANEL
ALT: 15 U/L (ref 0–44)
AST: 17 U/L (ref 15–41)
Albumin: 3.4 g/dL — ABNORMAL LOW (ref 3.5–5.0)
Alkaline Phosphatase: 48 U/L (ref 38–126)
Anion gap: 9 (ref 5–15)
BUN: 17 mg/dL (ref 6–20)
CO2: 27 mmol/L (ref 22–32)
Calcium: 8.9 mg/dL (ref 8.9–10.3)
Chloride: 98 mmol/L (ref 98–111)
Creatinine, Ser: 1 mg/dL (ref 0.61–1.24)
GFR, Estimated: >60 mL/min (ref >60)
Glucose, Bld: 159 mg/dL — ABNORMAL HIGH (ref 70–99)
Potassium: 4.3 mmol/L (ref 3.5–5.1)
Sodium: 134 mmol/L — ABNORMAL LOW (ref 135–145)
Total Bilirubin: 1.3 mg/dL — ABNORMAL HIGH (ref 0.3–1.2)
Total Protein: 7.2 g/dL (ref 6.5–8.1)

## 2021-09-23 LAB — PHOSPHORUS: Phosphorus: 3.6 mg/dL (ref 2.5–4.6)

## 2021-09-23 LAB — FERRITIN: Ferritin: 204 ng/mL (ref 24–336)

## 2021-09-23 LAB — C-REACTIVE PROTEIN: CRP: 10.4 mg/dL — ABNORMAL HIGH (ref <1.0)

## 2021-09-23 LAB — SURGICAL PATHOLOGY

## 2021-09-23 LAB — MAGNESIUM: Magnesium: 2.2 mg/dL (ref 1.7–2.4)

## 2021-09-23 MED ORDER — VANCOMYCIN HCL 1500 MG/300ML IV SOLN
1500.0000 mg | Freq: Once | INTRAVENOUS | Status: AC
  Administered 2021-09-23: 1500 mg via INTRAVENOUS
  Filled 2021-09-23: qty 300

## 2021-09-23 MED ORDER — VANCOMYCIN HCL IN DEXTROSE 1-5 GM/200ML-% IV SOLN
1000.0000 mg | Freq: Two times a day (BID) | INTRAVENOUS | Status: DC
  Administered 2021-09-24: 1000 mg via INTRAVENOUS
  Filled 2021-09-23: qty 200

## 2021-09-23 NOTE — Progress Notes (Signed)
°  Transition of Care [facility]) Screening Note   Patient Details  Name: LUISFELIPE ENGELSTAD Date of Birth: Dec 24, 1965   Transition of Care [facility]) CM/SW Contact:    Villa Herb, LCSWA Phone Number: 09/23/2021, 10:10 AM    Transition of Care Department [facility]) has reviewed patient and no TOC needs have been identified at this time. We will continue to monitor patient advancement through interdisciplinary progression rounds. If new patient transition needs arise, please place a TOC consult.

## 2021-09-23 NOTE — Progress Notes (Addendum)
Pharmacy Antibiotic Note  Todd Reilly is a 56 y.o. male admitted on 09/21/2021 with perianal abscess, s/p I&D. Zosyn started 2/22 and Pharmacy consulted for vancomycin dosing.  Plan: Vanc 1500mg  IV load x 1 Vanc 1000mg  IV q12 maintenance Monitor renal function F/u wound cultures and dc plans  Height: 5\' 10"  (177.8 cm) Weight: 84.3 kg (185 lb 13.6 oz) IBW/kg (Calculated) : 73 Calculated AUC = 434  Temp (24hrs), Avg:98 F (36.7 C), Min:97.6 F (36.4 C), Max:99.1 F (37.3 C)  Recent Labs  Lab 09/20/21 0653 09/21/21 1232 09/22/21 0556 09/23/21 0600  WBC 11.4* 13.0* 10.9* 17.5*  CREATININE 0.89 0.79 0.98 1.00    Estimated Creatinine Clearance: 86.2 mL/min (by C-G formula based on SCr of 1 mg/dL).    No Known Allergies  Antimicrobials this admission: 2/22 Zosyn >>  2/24 Vanc >>   Dose adjustments this admission:  Microbiology results: 2/23 Wound Cx: Rare GPC 2/22 MRSA PCR: Negative  Thank you for allowing pharmacy to be a part of this patients care.  3/24, PharmD Clinical Pharmacist 09/23/2021 5:17 PM

## 2021-09-23 NOTE — Progress Notes (Signed)
PROGRESS NOTE  Todd Reilly [id number] DOB: 1965-08-31 DOA: 09/21/2021 PCP: Redmond School, MD  Brief History:  56 year old male with a history of anxiety and constipation presenting with rectal pain that began on 09/18/2021.  The patient states that he has been struggling with constipation.  He was straining to have a bowel movement.  He subsequently had an episode of nonbilious nonbloody vomiting.  Since that time, the patient has had worsening rectal pain.  He went to the emergency department on 09/20/2021.  He was felt to have had hemorrhoids.  He was discharged in stable condition.  He continued to have worsening rectal pain and difficulty urinating.  He stated he took a whole bottle of MiraLAX on the morning of admission without a bowel movement.  As result, he presented for further evaluation.  The patient denied any fevers, chills, headache, chest pain, shortness breath, cough, hemoptysis.  He has not had any hematochezia or melena.  There is no hematemesis. In the ED, the patient was afebrile and hemodynamically stable with oxygen saturation 99% room air.  BMP showed sodium 134, potassium 3.9, bicarbonate 27, serum creatinine 0.79.  WBC 13.0, hemoglobin 14.4, platelets 146,000.  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  The patient was started on Zosyn.  General surgery was consulted to assist with management.  Patient was taken to OR for debridement on 09/22/21.     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  Foley catheter placed with about 1.4 L of urine drained -Continue IV Zosyn;  Add vanc pending culture data General surgery consult appreciated -09/22/21--I&D of perianal abscess--follow intra-op  cultures Continue IV Dilaudid Sitz baths post-op  Acute urinary retention  due to pelvic floor spasm from rectal pain.  Foley catheter placed with about 1.4 L of urine drained 09/23/21--foley discontinued>>pt able to urinate spontaneously  Anxiety Continue alprazolam Reduce dose temporarily due to interaction with ritonavir PDMP reviewed--receives xanax 1 mg, #90 monthly  GERD (gastroesophageal reflux disease) Restart PPI  Thrombocytopenia ([place]) Has been chronic dating back to Nov 2013 Monitor for signs of bleeding Serum B12--339 Folate--11.8 TSH--2.431 No hepatosplenomegaly on CT abd  COVID-19 virus infection- (present on admission) Incidentally positive.  Fully vaccinated.  No symptoms. Stable on RA -personally reviewed--no infiltrates or edema -Paxlovid started>>changed to molnupiravir due to drug interaction of xanax with ritonavir -Ferritin 71>>103>>204 -D-dimer 0.97>> 0.88>>1.78 -CRP--2.6>>10.4     Status is: Inpatient Remains inpatient appropriate because: severity of illness requiring IV antibiotics and surgical debridement                Family Communication:  no Family at bedside   Consultants:  general surgery   Code Status:  FULL    DVT Prophylaxis:  SCDs     Procedures: As Listed in Progress Note Above   Antibiotics: Zosyn 2/22>> Vanc 2/24>>         Subjective: Patient denies fevers, chills, headache, chest pain, dyspnea, nausea, vomiting, diarrhea, abdominal pain, dysuria, hematuria, hematochezia, and melena.  Rectal pain is better controlled   Objective: Vitals:   09/22/21 1905 09/22/21 2220 09/23/21 0412 09/23/21 1505  BP: 120/71 112/62 108/69 120/71  Pulse: 80 72  66 80  Resp: 20 18 18 18   Temp: 99.1 F (37.3 C) 97.7 F (36.5 C) 97.7 F (36.5 C) 97.6 F (36.4 C)  TempSrc: Oral   Oral  SpO2: 97% 98% 99% 98%  Weight:      Height:        Intake/Output Summary (Last 24 hours) at 09/23/2021 1752 Last data filed at  09/23/2021 1300 Gross per 24 hour  Intake --  Output 3100 ml  Net -3100 ml   Weight change:  Exam:  General:  Pt is alert, follows commands appropriately, not in acute distress HEENT: No icterus, No thrush, No neck mass, [place]/AT Cardiovascular: RRR, S1/S2, no rubs, no gallops Respiratory: CTA bilaterally, no wheezing, no crackles, no rhonchi Abdomen: Soft/+BS, non tender, non distended, no guarding Extremities: No edema, No lymphangitis, No petechiae, No rashes, no synovitis   Data Reviewed: I have personally reviewed following labs and imaging studies Basic Metabolic Panel: Recent Labs  Lab 09/20/21 0653 09/21/21 1232 09/22/21 0556 09/23/21 0600  NA 137 134* 137 134*  K 4.7 3.9 4.8 4.3  CL 102 100 102 98  CO2 29 27 29 27   GLUCOSE 114* 105* 116* 159*  BUN 14 13 11 17   CREATININE 0.89 0.79 0.98 1.00  CALCIUM 9.1 9.0 8.8* 8.9  MG  --   --  2.1 2.2  PHOS  --   --  3.3 3.6   Liver Function Tests: Recent Labs  Lab 09/20/21 0653 09/22/21 0556 09/23/21 0600  AST 16 14* 17  ALT 16 14 15   ALKPHOS 50 50 48  BILITOT 1.0 1.8* 1.3*  PROT 7.2 7.3 7.2  ALBUMIN 3.8 3.6 3.4*   Recent Labs  Lab 09/20/21 0653  LIPASE 28   No results for input(s): AMMONIA in the last 168 hours. Coagulation Profile: No results for input(s): INR, PROTIME in the last 168 hours. CBC: Recent Labs  Lab 09/20/21 0653 09/21/21 1232 09/22/21 0556 09/23/21 0600  WBC 11.4* 13.0* 10.9* 17.5*  NEUTROABS 9.4* 10.6*  --   --   HGB 14.7 14.4 13.8 13.7  HCT 43.0 41.1 41.2 39.7  MCV 91.5 91.3 90.2 87.8  PLT 143* 146* 147* 151   Cardiac Enzymes: No results for input(s): CKTOTAL, CKMB, CKMBINDEX, TROPONINI in the last 168 hours. BNP: Invalid input(s): POCBNP CBG: No results for input(s): GLUCAP in the last 168 hours. HbA1C: No results for input(s): HGBA1C in the last 72 hours. Urine analysis:    Component Value Date/Time   COLORURINE YELLOW 09/21/2021 1315   APPEARANCEUR CLEAR 09/21/2021 1315    LABSPEC 1.012 09/21/2021 1315   PHURINE 6.0 09/21/2021 1315   GLUCOSEU NEGATIVE 09/21/2021 1315   HGBUR NEGATIVE 09/21/2021 1315   BILIRUBINUR NEGATIVE 09/21/2021 1315   KETONESUR NEGATIVE 09/21/2021 1315   PROTEINUR NEGATIVE 09/21/2021 1315   NITRITE NEGATIVE 09/21/2021 1315   LEUKOCYTESUR SMALL (A) 09/21/2021 1315   Sepsis Labs: @LABRCNTIP (procalcitonin:4,lacticidven:4) ) Recent Results (from the past 240 hour(s))  Resp Panel by RT-PCR (Flu A&B, Covid) Nasopharyngeal Swab     Status: Abnormal   Collection Time: 09/21/21  4:30 PM   Specimen: Nasopharyngeal Swab; Nasopharyngeal(NP) swabs in vial transport medium  Result Value Ref Range Status   SARS Coronavirus 2 by RT PCR POSITIVE (A) NEGATIVE Final    Comment: (NOTE) SARS-CoV-2 target nucleic acids are DETECTED.  The SARS-CoV-2 RNA is generally detectable in upper respiratory specimens during the acute phase of infection. Positive results are indicative of the presence of the identified virus,  but do not rule out bacterial infection or co-infection with other pathogens not detected by the test. Clinical correlation with patient history and other diagnostic information is necessary to determine patient infection status. The expected result is Negative.  Fact Sheet for Patients: EntrepreneurPulse.com.au  Fact Sheet for Healthcare Providers: IncredibleEmployment.be  This test is not yet approved or cleared by the [place] FDA and  has been authorized for detection and/or diagnosis of SARS-CoV-2 by FDA under an Emergency Use Authorization (EUA).  This EUA will remain in effect (meaning this test can be used) for the duration of  the COVID-19 declaration under Section 564(b)(1) of the A ct, 21 U.S.C. section 360bbb-3(b)(1), unless the authorization is terminated or revoked sooner.     Influenza A by PCR NEGATIVE NEGATIVE Final   Influenza B by PCR NEGATIVE NEGATIVE Final     Comment: (NOTE) The Xpert Xpress SARS-CoV-2/FLU/RSV plus assay is intended as an aid in the diagnosis of influenza from Nasopharyngeal swab specimens and should not be used as a sole basis for treatment. Nasal washings and aspirates are unacceptable for Xpert Xpress SARS-CoV-2/FLU/RSV testing.  Fact Sheet for Patients: EntrepreneurPulse.com.au  Fact Sheet for Healthcare Providers: IncredibleEmployment.be  This test is not yet approved or cleared by the [place] FDA and has been authorized for detection and/or diagnosis of SARS-CoV-2 by FDA under an Emergency Use Authorization (EUA). This EUA will remain in effect (meaning this test can be used) for the duration of the COVID-19 declaration under Section 564(b)(1) of the Act, 21 U.S.C. section 360bbb-3(b)(1), unless the authorization is terminated or revoked.  Performed at [facility], [address], [place], [place] [address]   Surgical pcr screen     Status: None   Collection Time: 09/21/21 10:40 PM   Specimen: Nasal Mucosa; Nasal Swab  Result Value Ref Range Status   MRSA, PCR NEGATIVE NEGATIVE Final   Staphylococcus aureus NEGATIVE NEGATIVE Final    Comment: (NOTE) The Xpert SA Assay (FDA approved for NASAL specimens in patients 19 years of age and older), is one component of a comprehensive surveillance program. It is not intended to diagnose infection nor to guide or monitor treatment. Performed at [facility], [address] [place], [place] [address]   Aerobic/Anaerobic Culture w Gram Stain (surgical/deep wound)     Status: None (Preliminary result)   Collection Time: 09/22/21 11:26 AM   Specimen: PATH Other; Tissue  Result Value Ref Range Status   Specimen Description   Final    WOUND Performed at [facility], [address] [place], [place] [address]    Special Requests   Final    NONE Performed at [facility], [address], [place], [place] [address]    Gram Stain    Final    NO SQUAMOUS EPITHELIAL CELLS SEEN FEW WBC SEEN RARE GRAM POSITIVE COCCI    Culture   Final    TOO YOUNG TO READ Performed at [facility], [place] [address] [place], [place] [address]    Report Status PENDING  Incomplete     Scheduled Meds:  ALPRAZolam  0.5 mg Oral TID   Chlorhexidine Gluconate Cloth  6 each Topical Daily   docusate sodium  100 mg Oral BID   molnupiravir EUA  4 capsule Oral BID   Continuous Infusions:  piperacillin-tazobactam (ZOSYN)  IV 3.375 g (09/23/21 1653)   [START ON 09/24/2021] vancomycin     vancomycin      Procedures/Studies: DG Abdomen 1 View  Result Date: 09/21/2021 CLINICAL DATA:  Constipation.  Rectal pain. EXAM: ABDOMEN - 1 VIEW COMPARISON:  05/21/2020 FINDINGS: The bowel gas pattern is normal. No radiographic signs of constipation. Several right pelvic phleboliths remains stable. No radio-opaque calculi or other significant radiographic abnormality are seen. IMPRESSION: Negative. Electronically Signed   By: Marlaine Hind M.D.   On: 09/21/2021 13:12   CT ABDOMEN PELVIS W CONTRAST  Result Date: 09/21/2021 CLINICAL DATA:  Rectal pain and constipation. EXAM: CT ABDOMEN AND PELVIS WITH CONTRAST TECHNIQUE: Multidetector CT imaging of the abdomen and pelvis was performed using the standard protocol following bolus administration of intravenous contrast. RADIATION DOSE REDUCTION: This exam was performed according to the departmental dose-optimization program which includes automated exposure control, adjustment of the mA and/or kV according to patient size and/or use of iterative reconstruction technique. CONTRAST:  131mL OMNIPAQUE IOHEXOL 300 MG/ML  SOLN COMPARISON:  CT scan 05/06/2020 FINDINGS: Lower chest: The lung bases are clear of acute process. No pleural effusion or pulmonary lesions. The heart is normal in size. No pericardial effusion. The distal esophagus and aorta are unremarkable. Hepatobiliary: No hepatic lesions or intrahepatic biliary  dilatation. The gallbladder is unremarkable. No common bile duct dilatation. Pancreas: No mass, inflammation or ductal dilatation. Spleen: Normal size.  No focal lesions. Adrenals/Urinary Tract: The adrenal glands are normal. No renal lesions, hydronephrosis or renal calculi. No findings suspicious for pyelonephritis. The bladder contains a Foley catheter and is decompressed. Stomach/Bowel: Stomach, duodenum, small bowel and colon are unremarkable. No acute inflammatory changes, mass lesions or obstructive findings. The terminal ileum and appendix are normal. Moderate sigmoid colon diverticulosis without findings for acute diverticulitis. Vascular/Lymphatic: Scattered atherosclerotic calcifications involving the aorta but no aneurysm or dissection. The branch vessels are patent. The major venous structures are patent. No mesenteric or retroperitoneal mass or adenopathy the. Reproductive: The prostate gland and seminal vesicles are unremarkable. 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 MRI pelvis without and with contrast using perianal fistula protocol may be helpful for further evaluation if clinically necessary or for pre-surgical planning. The abscess measures approximately 2.6 x 2.1 cm. Musculoskeletal: No significant bony findings. IMPRESSION: 1. 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 MRI pelvis without and with contrast using perianal fistula protocol may be helpful for further evaluation if clinically necessary or for pre-surgical planning. 2. No acute abdominal/pelvic findings and no mass lesions or adenopathy. 3. Aortic atherosclerosis. Aortic Atherosclerosis (ICD10-I70.0). Electronically Signed   By: Marijo Sanes M.D.   On: 09/21/2021 15:15   DG CHEST PORT 1 VIEW  Result Date: 09/21/2021 CLINICAL DATA:  History of perianal abscess with COVID positivity EXAM: PORTABLE CHEST 1 VIEW COMPARISON:   07/19/2015 FINDINGS: Cardiac shadow is within normal limits. The lungs are well aerated bilaterally. No focal infiltrate or effusion is seen. No bony abnormality is noted. IMPRESSION: No active disease. Electronically Signed   By: Inez Catalina M.D.   On: 09/21/2021 22:38    Todd Eva, DO  [facility]  If 7PM-7AM, please contact night-coverage [email or web address] Password [facility] 09/23/2021, 5:52 PM   LOS: 2 days

## 2021-09-23 NOTE — Discharge Instructions (Addendum)
Instructions for Discharge  Keep area clean and dry. Use feminine pads to collect any drainage.  There is a small drain in the cavity expected to drain bloody fluid.  Take antibiotic. Sitz baths multiple times a day for comfort and after Bms. Or you can use a shower hand held piece to cleanse the area.   Pain Expectations and Narcotics: -After surgery you will have pain associated with your incisions and this is normal. The pain is muscular and nerve pain, and will get better with time. -You are encouraged and expected to take non narcotic medications like tylenol and ibuprofen (when able) to treat pain as multiple modalities can aid with pain treatment. -Narcotics are only used when pain is severe or there is breakthrough pain. -You are not expected to have a pain score of 0 after surgery, as we cannot prevent pain. A pain score of 3-4 that allows you to be functional, move, walk, and tolerate some activity is the goal. The pain will continue to improve over the days after surgery and is dependent on your surgery. -Due to [place] law, we are only able to give a certain amount of pain medication to treat post operative pain, and we only give additional narcotics on a patient by patient basis.  -For most laparoscopic surgery, studies have shown that the majority of patients only need 10-15 narcotic pills, and for open surgeries most patients only need 15-20.   -Having appropriate expectations of pain and knowledge of pain management with non narcotics is important as we do not want anyone to become addicted to narcotic pain medication.  -Using ice packs in the first 48 hours and heating pads after 48 hours, wearing an abdominal binder (when recommended), and using over the counter medications are all ways to help with pain management.   -Simple acts like meditation and mindfulness practices after surgery can also help with pain control and research has proven the benefit of these  practices.  Medication: Take tylenol and ibuprofen as needed for pain control, alternating every 4-6 hours.  Example:  Tylenol 1000mg  @ 6am, 12noon, 6pm, (Do not exceed 4000mg  of tylenol a day). Ibuprofen 800mg  @ 9am, 3pm, 9pm, 3am (Do not exceed 3600mg  of ibuprofen a day).  Take Roxicodone for breakthrough pain every 4 hours.  Take Colace for constipation related to narcotic pain medication. If you do not have a bowel movement in 2 days, take Miralax over the counter.  Drink plenty of water to also prevent constipation.   Contact Information: If you have questions or concerns, please call our office, [phone], Monday- Thursday 8AM-5PM and Friday 8AM-12Noon.  If it is after hours or on the weekend, please call [facility] Main Number, [phone], [phone], and ask to speak to the surgeon on call for Dr. Sunday at [facility]

## 2021-09-23 NOTE — Progress Notes (Signed)
[facility]  Pain much improved. Having flatus.   Foley still in for retention.  BP 108/69 (BP Location: Left Arm)    Pulse 66    Temp 97.7 F (36.5 C)    Resp 18    Ht 5\' 10"  (1.778 m)    Wt 84.3 kg    SpO2 99%    BMI 26.67 kg/m  Gauze removed. Penrose in place and saline flushed. Needs sitz baths. Less indurated and nontender   Patient s/p I&D of perianal abscess and skin tag removal.  PRN for pain If goes home send with roxicodone 5 mg q 4 PRN # 15 Augmentin for 5 days Sitz baths q 4 and as needed after Bms Miralax now Will need to go home on stool softener to ensure having Bms  Office can fill out his FMLA/ Short term disability, may be 2-4 weeks until he is able to work again  Will see in office 09/29/2021. Office will call with appt.  Curlene Labrum, MD [facility] [address] [place], [place] [address] [phone] (office)

## 2021-09-23 NOTE — Assessment & Plan Note (Signed)
due to pelvic floor spasm from rectal pain.  Foley catheter placed with about 1.4 L of urine drained 09/23/21--foley discontinued>>pt able to urinate spontaneously

## 2021-09-24 LAB — COMPREHENSIVE METABOLIC PANEL
ALT: 14 U/L (ref 0–44)
AST: 16 U/L (ref 15–41)
Albumin: 3.4 g/dL — ABNORMAL LOW (ref 3.5–5.0)
Alkaline Phosphatase: 47 U/L (ref 38–126)
Anion gap: 8 (ref 5–15)
BUN: 16 mg/dL (ref 6–20)
CO2: 27 mmol/L (ref 22–32)
Calcium: 8.9 mg/dL (ref 8.9–10.3)
Chloride: 102 mmol/L (ref 98–111)
Creatinine, Ser: 0.91 mg/dL (ref 0.61–1.24)
GFR, Estimated: >60 mL/min (ref >60)
Glucose, Bld: 127 mg/dL — ABNORMAL HIGH (ref 70–99)
Potassium: 3.9 mmol/L (ref 3.5–5.1)
Sodium: 137 mmol/L (ref 135–145)
Total Bilirubin: 1.4 mg/dL — ABNORMAL HIGH (ref 0.3–1.2)
Total Protein: 7.2 g/dL (ref 6.5–8.1)

## 2021-09-24 LAB — CBC
HCT: 38.7 % — ABNORMAL LOW (ref 39.0–52.0)
Hemoglobin: 13.6 g/dL (ref 13.0–17.0)
MCH: 31.6 pg (ref 26.0–34.0)
MCHC: 35.1 g/dL (ref 30.0–36.0)
MCV: 90 fL (ref 80.0–100.0)
Platelets: 145 10*3/uL — ABNORMAL LOW (ref 150–400)
RBC: 4.3 MIL/uL (ref 4.22–5.81)
RDW: 12.2 % (ref 11.5–15.5)
WBC: 15.6 10*3/uL — ABNORMAL HIGH (ref 4.0–10.5)
nRBC: 0 % (ref 0.0–0.2)

## 2021-09-24 LAB — FERRITIN: Ferritin: 203 ng/mL (ref 24–336)

## 2021-09-24 LAB — C-REACTIVE PROTEIN: CRP: 5.4 mg/dL — ABNORMAL HIGH (ref <1.0)

## 2021-09-24 LAB — PHOSPHORUS: Phosphorus: 2.9 mg/dL (ref 2.5–4.6)

## 2021-09-24 LAB — D-DIMER, QUANTITATIVE: D-Dimer, Quant: 1.82 ug/mL-FEU — ABNORMAL HIGH (ref 0.00–0.50)

## 2021-09-24 LAB — MAGNESIUM: Magnesium: 2.1 mg/dL (ref 1.7–2.4)

## 2021-09-24 MED ORDER — MOLNUPIRAVIR EUA 200MG CAPSULE: 4.0000 | ORAL_CAPSULE | Freq: Two times a day (BID) | ORAL | 0 refills | Status: AC

## 2021-09-24 MED ORDER — DOXYCYCLINE HYCLATE 100 MG PO TABS: 100.0000 mg | ORAL_TABLET | Freq: Two times a day (BID) | ORAL | 0 refills | Status: DC

## 2021-09-24 MED ORDER — AMOXICILLIN-POT CLAVULANATE 875-125 MG PO TABS
1.0000 | ORAL_TABLET | Freq: Two times a day (BID) | ORAL | Status: DC
  Administered 2021-09-24: 1 via ORAL
  Filled 2021-09-24: qty 1

## 2021-09-24 MED ORDER — AMOXICILLIN-POT CLAVULANATE 875-125 MG PO TABS
1.0000 | ORAL_TABLET | Freq: Two times a day (BID) | ORAL | 0 refills | Status: DC
Start: 2021-09-24 — End: 2021-10-06

## 2021-09-24 MED ORDER — DOXYCYCLINE HYCLATE 100 MG PO TABS
100.0000 mg | ORAL_TABLET | Freq: Two times a day (BID) | ORAL | Status: DC
Start: 2021-09-24 — End: 2021-09-24
  Administered 2021-09-24: 100 mg via ORAL
  Filled 2021-09-24: qty 1

## 2021-09-24 MED ORDER — OXYCODONE HCL 5 MG PO TABS: 10.0000 mg | ORAL_TABLET | Freq: Four times a day (QID) | ORAL | 0 refills | Status: DC | PRN

## 2021-09-24 MED ORDER — OXYCODONE HCL 10 MG PO TABS: 10.0000 mg | ORAL_TABLET | Freq: Four times a day (QID) | ORAL | 0 refills | Status: DC | PRN

## 2021-09-24 NOTE — Discharge Summary (Addendum)
Physician Discharge Summary   Patient: Todd Reilly MRN: [id number] DOB: May 14, 1966  Admit date:     09/21/2021  Discharge date: 09/24/21  Discharge Physician: Shanon Brow Alyvia Derk   PCP: Redmond School, MD   Recommendations at discharge:   Please follow up with primary care provider within 1-2 weeks  Please repeat BMP and CBC in one week  Please follow up on/with Dr. Constance Haw on 10/04/21     Hospital Course: 56 year old male with a history of anxiety and constipation presenting with rectal pain that began on 09/18/2021.  The patient states that he has been struggling with constipation.  He was straining to have a bowel movement.  He subsequently had an episode of nonbilious nonbloody vomiting.  Since that time, the patient has had worsening rectal pain.  He went to the emergency department on 09/20/2021.  He was felt to have had hemorrhoids.  He was discharged in stable condition.  He continued to have worsening rectal pain and difficulty urinating.  He stated he took a whole bottle of MiraLAX on the morning of admission without a bowel movement.  As result, he presented for further evaluation.  The patient denied any fevers, chills, headache, chest pain, shortness breath, cough, hemoptysis.  He has not had any hematochezia or melena.  There is no hematemesis. In the ED, the patient was afebrile and hemodynamically stable with oxygen saturation 99% room air.  BMP showed sodium 134, potassium 3.9, bicarbonate 27, serum creatinine 0.79.  WBC 13.0, hemoglobin 14.4, platelets 146,000.  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  The patient was started on Zosyn.  General surgery was consulted to assist with management.  Patient was taken to OR for debridement on 09/22/21.  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  Foley catheter placed with about 1.4 L of urine drained -Continue IV Zosyn;  Add vanc pending culture data General surgery consult appreciated -09/22/21--I&D of perianal abscess--follow intra-op cultures Continue IV Dilaudid Sitz baths post-op D/c home with amox/clav+doxy x 7 more days  Acute urinary retention  due to pelvic floor spasm from rectal pain.  Foley catheter placed with about 1.4 L of urine drained 09/23/21--foley discontinued>>pt able to urinate spontaneously  Anxiety Continue alprazolam Reduce dose temporarily due to interaction with ritonavir PDMP reviewed--receives xanax 1 mg, #90 monthly  GERD (gastroesophageal reflux disease) Restart PPI  Thrombocytopenia ([name]) Has been chronic dating back to Nov 2013 Monitor for signs of bleeding Serum B12--339 Folate--11.8 TSH--2.431 No hepatosplenomegaly on CT abd Overall stable through the hospitalization  COVID-19 virus infection- (present on admission) Incidentally positive.  Fully vaccinated.  No symptoms. Stable on RA -personally reviewed--no infiltrates or edema -Paxlovid started>>changed to molnupiravir due to drug interaction of xanax with ritonavir -Ferritin 71>>103>>204>>203 -D-dimer 0.97>> 0.88>>1.78>>1.82 -CRP--2.6>>10.4 -finish 5 more doses molnupiravir after d/c         Pain control - [place] Controlled Substance Reporting System database was reviewed. and patient was instructed, not to drive, operate heavy machinery, perform activities at heights, swimming or participation in water activities or provide baby-sitting services while on Pain, Sleep and Anxiety Medications; until their outpatient Physician has advised to do so again. Also recommended to not to take more than prescribed  Pain, Sleep and Anxiety Medications.   Consultants: general surgery Procedures performed: I&D perirectal abscess  Disposition:  Home Diet recommendation:  Regular diet  DISCHARGE MEDICATION: Allergies as of 09/24/2021   No Known Allergies      Medication List     STOP taking these medications    EX-LAX PO   hydrocortisone 2.5 % rectal cream Commonly known as: ANUSOL-HC   ondansetron 4 MG disintegrating tablet Commonly known as: Zofran ODT   psyllium 58.6 % packet Commonly known as: METAMUCIL       TAKE these medications    ALPRAZolam 1 MG tablet Commonly known as: XANAX Take 1 mg by mouth 3 (three) times daily as needed for anxiety.   amoxicillin-clavulanate 875-125 MG tablet Commonly known as: AUGMENTIN Take 1 tablet by mouth every 12 (twelve) hours.   dicyclomine 20 MG tablet Commonly known as: BENTYL Take 1 tablet (20 mg total) by mouth 2 (two) times daily.   docusate sodium 250 MG capsule Commonly known as: COLACE Take 1 capsule (250 mg total) by mouth daily.   doxycycline 100 MG tablet Commonly known as: VIBRA-TABS Take 1 tablet (100 mg total) by mouth every 12 (twelve) hours.   Fish Oil 1000 MG Caps Take 1 capsule by mouth daily.   molnupiravir EUA 200 mg Caps capsule Commonly known as: LAGEVRIO Take 4 capsules (800 mg total) by mouth 2 (two) times daily for 5 days. X 5 doses left   omeprazole 40 MG capsule Commonly known as: PRILOSEC Take 1 capsule (40 mg total) by mouth daily.   oxyCODONE 5 MG immediate release tablet Commonly known as: Oxy IR/ROXICODONE Take 2 tablets (10 mg total) by mouth every 6 (six) hours as needed for severe pain.   polyethylene glycol 17 g packet Commonly known as: MIRALAX / GLYCOLAX Take 17 g by mouth daily.        Follow-up Information     Virl Cagey, MD Follow up on 09/29/2021.   Specialty: General Surgery Why: post op check Contact information: [address] [facility] [facility] [id number] [phone]                 Discharge Exam: [place] Weights   09/21/21 1118 09/21/21 2107  Weight: 79.4 kg 84.3 kg    HEENT:  [place]/AT, No thrush, no icterus CV:  RRR, no rub, no S3, no S4 Lung:  CTA, no wheeze, no rhonchi Abd:  soft/+BS, NT Ext:  No edema, no lymphangitis, no synovitis, no rash   Condition at discharge: stable  The results of significant diagnostics from this hospitalization (including imaging, microbiology, ancillary and laboratory) are listed below for reference.   Imaging Studies: DG Abdomen 1 View  Result Date: 09/21/2021 CLINICAL DATA:  Constipation.  Rectal pain. EXAM: ABDOMEN - 1 VIEW COMPARISON:  05/21/2020 FINDINGS: The bowel gas pattern is normal. No radiographic signs of constipation. Several right pelvic phleboliths remains stable. No radio-opaque calculi or other significant radiographic abnormality are seen. IMPRESSION: Negative. Electronically Signed   By: Marlaine Hind M.D.   On: 09/21/2021 13:12   CT ABDOMEN PELVIS W CONTRAST  Result Date: 09/21/2021 CLINICAL DATA:  Rectal pain and constipation. EXAM: CT ABDOMEN AND PELVIS WITH CONTRAST TECHNIQUE: Multidetector CT imaging of the abdomen and pelvis was performed using the standard protocol following bolus administration of intravenous contrast. RADIATION DOSE REDUCTION: This exam was performed according to the departmental dose-optimization program which includes automated exposure control, adjustment of the mA and/or kV according to patient size and/or  use of iterative reconstruction technique. CONTRAST:  149mL OMNIPAQUE IOHEXOL 300 MG/ML  SOLN COMPARISON:  CT scan 05/06/2020 FINDINGS: Lower chest: The lung bases are clear of acute process. No pleural effusion or pulmonary lesions. The heart is normal in size. No pericardial effusion. The distal esophagus and aorta are unremarkable. Hepatobiliary: No hepatic lesions or intrahepatic biliary dilatation. The gallbladder is unremarkable. No common bile duct dilatation. Pancreas: No mass, inflammation or ductal dilatation. Spleen: Normal size.  No focal lesions. Adrenals/Urinary  Tract: The adrenal glands are normal. No renal lesions, hydronephrosis or renal calculi. No findings suspicious for pyelonephritis. The bladder contains a Foley catheter and is decompressed. Stomach/Bowel: Stomach, duodenum, small bowel and colon are unremarkable. No acute inflammatory changes, mass lesions or obstructive findings. The terminal ileum and appendix are normal. Moderate sigmoid colon diverticulosis without findings for acute diverticulitis. Vascular/Lymphatic: Scattered atherosclerotic calcifications involving the aorta but no aneurysm or dissection. The branch vessels are patent. The major venous structures are patent. No mesenteric or retroperitoneal mass or adenopathy the. Reproductive: The prostate gland and seminal vesicles are unremarkable. 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 MRI pelvis without and with contrast using perianal fistula protocol may be helpful for further evaluation if clinically necessary or for pre-surgical planning. The abscess measures approximately 2.6 x 2.1 cm. Musculoskeletal: No significant bony findings. IMPRESSION: 1. 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 MRI pelvis without and with contrast using perianal fistula protocol may be helpful for further evaluation if clinically necessary or for pre-surgical planning. 2. No acute abdominal/pelvic findings and no mass lesions or adenopathy. 3. Aortic atherosclerosis. Aortic Atherosclerosis (ICD10-I70.0). Electronically Signed   By: Marijo Sanes M.D.   On: 09/21/2021 15:15   DG CHEST PORT 1 VIEW  Result Date: 09/21/2021 CLINICAL DATA:  History of perianal abscess with COVID positivity EXAM: PORTABLE CHEST 1 VIEW COMPARISON:  07/19/2015 FINDINGS: Cardiac shadow is within normal limits. The lungs are well aerated bilaterally. No focal infiltrate or effusion is seen. No bony abnormality is noted. IMPRESSION: No active  disease. Electronically Signed   By: Inez Catalina M.D.   On: 09/21/2021 22:38    Microbiology: Results for orders placed or performed during the hospital encounter of 09/21/21  Resp Panel by RT-PCR (Flu A&B, Covid) Nasopharyngeal Swab     Status: Abnormal   Collection Time: 09/21/21  4:30 PM   Specimen: Nasopharyngeal Swab; Nasopharyngeal(NP) swabs in vial transport medium  Result Value Ref Range Status   SARS Coronavirus 2 by RT PCR POSITIVE (A) NEGATIVE Final    Comment: (NOTE) SARS-CoV-2 target nucleic acids are DETECTED.  The SARS-CoV-2 RNA is generally detectable in upper respiratory specimens during the acute phase of infection. Positive results are indicative of the presence of the identified virus, but do not rule out bacterial infection or co-infection with other pathogens not detected by the test. Clinical correlation with patient history and other diagnostic information is necessary to determine patient infection status. The expected result is Negative.  Fact Sheet for Patients: EntrepreneurPulse.com.au  Fact Sheet for Healthcare Providers: IncredibleEmployment.be  This test is not yet approved or cleared by the [place] FDA and  has been authorized for detection and/or diagnosis of SARS-CoV-2 by FDA under an Emergency Use Authorization (EUA).  This EUA will remain in effect (meaning this test can be used) for the duration of  the COVID-19 declaration under Section  564(b)(1) of the A ct, 21 U.S.C. section 360bbb-3(b)(1), unless the authorization is terminated or revoked sooner.     Influenza A by PCR NEGATIVE NEGATIVE Final   Influenza B by PCR NEGATIVE NEGATIVE Final    Comment: (NOTE) The Xpert Xpress SARS-CoV-2/FLU/RSV plus assay is intended as an aid in the diagnosis of influenza from Nasopharyngeal swab specimens and should not be used as a sole basis for treatment. Nasal washings and aspirates are unacceptable for Xpert  Xpress SARS-CoV-2/FLU/RSV testing.  Fact Sheet for Patients: EntrepreneurPulse.com.au  Fact Sheet for Healthcare Providers: IncredibleEmployment.be  This test is not yet approved or cleared by the [place] FDA and has been authorized for detection and/or diagnosis of SARS-CoV-2 by FDA under an Emergency Use Authorization (EUA). This EUA will remain in effect (meaning this test can be used) for the duration of the COVID-19 declaration under Section 564(b)(1) of the Act, 21 U.S.C. section 360bbb-3(b)(1), unless the authorization is terminated or revoked.  Performed at [facility], [address], [place], [place] [address]   Surgical pcr screen     Status: None   Collection Time: 09/21/21 10:40 PM   Specimen: Nasal Mucosa; Nasal Swab  Result Value Ref Range Status   MRSA, PCR NEGATIVE NEGATIVE Final   Staphylococcus aureus NEGATIVE NEGATIVE Final    Comment: (NOTE) The Xpert SA Assay (FDA approved for NASAL specimens in patients 71 years of age and older), is one component of a comprehensive surveillance program. It is not intended to diagnose infection nor to guide or monitor treatment. Performed at [facility], [address] [place], [place] [address]   Aerobic/Anaerobic Culture w Gram Stain (surgical/deep wound)     Status: None (Preliminary result)   Collection Time: 09/22/21 11:26 AM   Specimen: PATH Other; Tissue  Result Value Ref Range Status   Specimen Description   Final    WOUND Performed at [facility], [address] [place], [place] [address]    Special Requests   Final    NONE Performed at [facility], [address] [place], [place] [address]    Gram Stain   Final    NO SQUAMOUS EPITHELIAL CELLS SEEN FEW WBC SEEN RARE GRAM POSITIVE COCCI    Culture   Final    CULTURE REINCUBATED FOR BETTER GROWTH Performed at [facility], [place] [address] [place], [place] [address]    Report Status PENDING  Incomplete     Labs: CBC: Recent Labs  Lab 09/20/21 0653 09/21/21 1232 09/22/21 0556 09/23/21 0600 09/24/21 0538  WBC 11.4* 13.0* 10.9* 17.5* 15.6*  NEUTROABS 9.4* 10.6*  --   --   --   HGB 14.7 14.4 13.8 13.7 13.6  HCT 43.0 41.1 41.2 39.7 38.7*  MCV 91.5 91.3 90.2 87.8 90.0  PLT 143* 146* 147* 151 [id number]*   Basic Metabolic Panel: Recent Labs  Lab 09/20/21 0653 09/21/21 1232 09/22/21 0556 09/23/21 0600 09/24/21 0538  NA 137 134* 137 134* 137  K 4.7 3.9 4.8 4.3 3.9  CL 102 100 102 98 102  CO2 29 27 29 27 27   GLUCOSE 114* 105* 116* 159* 127*  BUN 14 13 11 17 16   CREATININE 0.89 0.79 0.98 1.00 0.91  CALCIUM 9.1 9.0 8.8* 8.9 8.9  MG  --   --  2.1 2.2 2.1  PHOS  --   --  3.3 3.6 2.9   Liver Function Tests: Recent Labs  Lab 09/20/21 0653 09/22/21 0556 09/23/21 0600 09/24/21 0538  AST 16 14* 17 16  ALT 16 14 15 14   ALKPHOS 50 50 48 47  BILITOT 1.0 1.8* 1.3* 1.4*  PROT 7.2 7.3 7.2 7.2  ALBUMIN 3.8 3.6 3.4* 3.4*   CBG: No results for input(s): GLUCAP in the last 168 hours.  Discharge time spent: greater than 30 minutes.  Signed: Orson Eva, MD [facility] 09/24/2021

## 2021-09-24 NOTE — Progress Notes (Signed)
Nsg Discharge Note  Admit Date:  09/21/2021 Discharge date: 09/24/2021   [place] to be D/C'd Home per MD order.  AVS completed.  Patient/caregiver able to verbalize understanding.  Discharge Medication: Allergies as of 09/24/2021   No Known Allergies      Medication List     STOP taking these medications    EX-LAX PO   hydrocortisone 2.5 % rectal cream Commonly known as: ANUSOL-HC   ondansetron 4 MG disintegrating tablet Commonly known as: Zofran ODT   psyllium 58.6 % packet Commonly known as: METAMUCIL       TAKE these medications    ALPRAZolam 1 MG tablet Commonly known as: XANAX Take 1 mg by mouth 3 (three) times daily as needed for anxiety.   amoxicillin-clavulanate 875-125 MG tablet Commonly known as: AUGMENTIN Take 1 tablet by mouth every 12 (twelve) hours.   dicyclomine 20 MG tablet Commonly known as: BENTYL Take 1 tablet (20 mg total) by mouth 2 (two) times daily.   docusate sodium 250 MG capsule Commonly known as: COLACE Take 1 capsule (250 mg total) by mouth daily.   doxycycline 100 MG tablet Commonly known as: VIBRA-TABS Take 1 tablet (100 mg total) by mouth every 12 (twelve) hours.   Fish Oil 1000 MG Caps Take 1 capsule by mouth daily.   molnupiravir EUA 200 mg Caps capsule Commonly known as: LAGEVRIO Take 4 capsules (800 mg total) by mouth 2 (two) times daily for 5 days. X 5 doses left   omeprazole 40 MG capsule Commonly known as: PRILOSEC Take 1 capsule (40 mg total) by mouth daily.   Oxycodone HCl 10 MG Tabs Take 1 tablet (10 mg total) by mouth every 6 (six) hours as needed for moderate pain.   polyethylene glycol 17 g packet Commonly known as: MIRALAX / GLYCOLAX Take 17 g by mouth daily.        Discharge Assessment: Vitals:   09/23/21 2053 09/24/21 0639  BP: 114/80 107/74  Pulse: 78 68  Resp: 18 17  Temp: 98.4 F (36.9 C) 97.8 F (36.6 C)  SpO2: 98% 98%   Skin clean, dry and intact without evidence of skin break  down, no evidence of skin tears noted. IV catheter discontinued intact. Site without signs and symptoms of complications - no redness or edema noted at insertion site, patient denies c/o pain - only slight tenderness at site.  Dressing with slight pressure applied.  D/c Instructions-Education: Discharge instructions given to patient/family with verbalized understanding. D/c education completed with patient/family including follow up instructions, medication list, d/c activities limitations if indicated, with other d/c instructions as indicated by MD - patient able to verbalize understanding, all questions fully answered. Patient instructed to return to ED, call 911, or call MD for any changes in condition.  Patient escorted via [place], and D/C home via private auto.  Kathie Rhodes, RN 09/24/2021 12:31 PM

## 2021-09-26 ENCOUNTER — Telehealth: Payer: Self-pay | Admitting: *Deleted

## 2021-09-26 NOTE — Telephone Encounter (Signed)
Received call from patient.   Reports that he is having increased pain with perianal fistula/ abscess. States that he has been taking (3) tabs of Oxy 5mg  a needed.   States that he has not been using IBU/APAP as directed. Advised to alternate IBU 600mg  and APAP 1000mg  Q 6hrs PRN. Advised to use Oxy as prescribed (2) tabs PRN prior to BM.   Advised to use stool softeners to avoid straining. Advised to use Sitz baths to ease pain.   Dr. made aware.

## 2021-09-27 ENCOUNTER — Encounter (HOSPITAL_COMMUNITY): Payer: Self-pay | Admitting: General Surgery

## 2021-09-27 LAB — AEROBIC/ANAEROBIC CULTURE W GRAM STAIN (SURGICAL/DEEP WOUND): Gram Stain: NONE SEEN

## 2021-09-28 NOTE — Telephone Encounter (Signed)
noted 

## 2021-09-29 ENCOUNTER — Other Ambulatory Visit: Payer: Self-pay

## 2021-09-29 ENCOUNTER — Encounter: Payer: Self-pay | Admitting: General Surgery

## 2021-09-29 ENCOUNTER — Ambulatory Visit (INDEPENDENT_AMBULATORY_CARE_PROVIDER_SITE_OTHER): Payer: Self-pay | Admitting: General Surgery

## 2021-09-29 VITALS — BP 101/67 | HR 78 | Temp 97.9°F | Resp 18 | Ht 70.0 in | Wt 184.0 lb

## 2021-09-29 DIAGNOSIS — K610 Anal abscess: Secondary | ICD-10-CM

## 2021-09-29 MED ORDER — OXYCODONE HCL 5 MG PO TABS: 5.0000 mg | ORAL_TABLET | Freq: Four times a day (QID) | ORAL | 0 refills | Status: DC | PRN

## 2021-09-29 NOTE — Patient Instructions (Addendum)
Medication: ?Take tylenol and ibuprofen as needed for pain control, alternating every 4-6 hours.  ?Example:  ?Tylenol 1000mg  @ 6am, 12noon, 6pm, (Do not exceed 4000mg  of tylenol a day). Ibuprofen 800mg  @ 9am, 3pm, 9pm, 3am (Do not exceed 3600mg  of ibuprofen a day).  ?Take Roxicodone for breakthrough pain every 6 hours.  ? ?Continue to keep stools soft and regular. Sitz baths. ?Finish your antibiotics.  ?Will see next week to determine if ready to return to work.  ? ?

## 2021-09-29 NOTE — Progress Notes (Signed)
[facility] ? ?Improving and pain better.  ? ?BP 101/67   Pulse 78   Temp 97.9 ?F (36.6 ?C) (Other (Comment))   Resp 18   Ht 5\' 10"  (1.778 m)   Wt 184 lb (83.5 kg)   SpO2 97%   BMI 26.40 kg/m?  ? ?Area healing, mild induration ,mild tenderness, no drainage, incision with granulation, penrose has fallen out and suture is out ? ?Patient s/p perianal abscess I&D.  ?Take tylenol and ibuprofen as needed for pain control, alternating every 4-6 hours.  ?Example:  ?Tylenol 1000mg  @ 6am, 12noon, 6pm, 79midnight (Do not exceed 4000mg  of tylenol a day). Ibuprofen 800mg  @ 9am, 3pm, 9pm, 3am (Do not exceed 3600mg  of ibuprofen a day).  ?Take Roxicodone for breakthrough pain every 6 hours.  ? ?Continue to keep stools soft and regular. Sitz baths. ?Finish your antibiotics.  ?Will see next week to determine if ready to return to work.  ?Refilled roxicodone  mg q 6 PRN # 15 at [facility] ? ?Curlene Labrum, MD ?[facility] ?[place], [place] [address] ?[phone] (office) ? ? ?

## 2021-10-06 ENCOUNTER — Encounter: Payer: Self-pay | Admitting: *Deleted

## 2021-10-06 ENCOUNTER — Encounter: Payer: Self-pay | Admitting: General Surgery

## 2021-10-06 ENCOUNTER — Other Ambulatory Visit: Payer: Self-pay

## 2021-10-06 ENCOUNTER — Ambulatory Visit (INDEPENDENT_AMBULATORY_CARE_PROVIDER_SITE_OTHER): Payer: Self-pay | Admitting: General Surgery

## 2021-10-06 VITALS — BP 130/81 | HR 70 | Temp 98.5°F | Resp 18 | Ht 70.0 in | Wt 181.6 lb

## 2021-10-06 DIAGNOSIS — K610 Anal abscess: Secondary | ICD-10-CM

## 2021-10-06 NOTE — Patient Instructions (Signed)
Diet and activity as tolerated  ?Return to work 3/13 without restrictions  ?

## 2021-10-06 NOTE — Progress Notes (Signed)
[facility] ? ?Doing well. Having Bms. Minimal pain except when he turns while sitting, he has some burning. ? ?BP 130/81   Pulse 70   Temp 98.5 ?F (36.9 ?C) (Oral)   Resp 18   Ht 5\' 10"  (1.778 m)   Wt 181 lb 9.6 oz (82.4 kg)   SpO2 97%   BMI 26.06 kg/m?  ?Almost healed I&D site, minimal deepithelized area < 1cm X0.5cm in size ? ?Patient s/p I&D of perianal abscess, no fistula noted. Doing well.  ?Discussed pathology of the abscess. ?Answered other questions about this mild thrombocytopenia (123-150 range since 2013) ?Discussed keeping stools regular and soft. ?Return to work 3/13 without restrictions  ? ?4/13, MD ?[facility] ?[address] ?[place], [place] ?[phone] (office) ? ?

## 2021-12-29 ENCOUNTER — Ambulatory Visit: Payer: Self-pay | Admitting: Gastroenterology

## 2022-02-06 DIAGNOSIS — F419 Anxiety disorder, unspecified: Secondary | ICD-10-CM | POA: Diagnosis not present

## 2022-02-06 DIAGNOSIS — E663 Overweight: Secondary | ICD-10-CM | POA: Diagnosis not present

## 2022-02-06 DIAGNOSIS — Z6827 Body mass index (BMI) 27.0-27.9, adult: Secondary | ICD-10-CM | POA: Diagnosis not present

## 2022-06-05 DIAGNOSIS — Z6825 Body mass index (BMI) 25.0-25.9, adult: Secondary | ICD-10-CM | POA: Diagnosis not present

## 2022-06-05 DIAGNOSIS — F419 Anxiety disorder, unspecified: Secondary | ICD-10-CM | POA: Diagnosis not present

## 2022-09-04 DIAGNOSIS — F419 Anxiety disorder, unspecified: Secondary | ICD-10-CM | POA: Diagnosis not present

## 2022-09-04 DIAGNOSIS — J069 Acute upper respiratory infection, unspecified: Secondary | ICD-10-CM | POA: Diagnosis not present

## 2022-09-04 DIAGNOSIS — Z6826 Body mass index (BMI) 26.0-26.9, adult: Secondary | ICD-10-CM | POA: Diagnosis not present

## 2022-12-08 DIAGNOSIS — F419 Anxiety disorder, unspecified: Secondary | ICD-10-CM | POA: Diagnosis not present

## 2022-12-08 DIAGNOSIS — Z6825 Body mass index (BMI) 25.0-25.9, adult: Secondary | ICD-10-CM | POA: Diagnosis not present

## 2022-12-13 DIAGNOSIS — H5203 Hypermetropia, bilateral: Secondary | ICD-10-CM | POA: Diagnosis not present

## 2022-12-22 DIAGNOSIS — E538 Deficiency of other specified B group vitamins: Secondary | ICD-10-CM | POA: Diagnosis not present

## 2022-12-22 DIAGNOSIS — Z6825 Body mass index (BMI) 25.0-25.9, adult: Secondary | ICD-10-CM | POA: Diagnosis not present

## 2022-12-22 DIAGNOSIS — E781 Pure hyperglyceridemia: Secondary | ICD-10-CM | POA: Diagnosis not present

## 2022-12-22 DIAGNOSIS — Z125 Encounter for screening for malignant neoplasm of prostate: Secondary | ICD-10-CM | POA: Diagnosis not present

## 2022-12-22 DIAGNOSIS — E663 Overweight: Secondary | ICD-10-CM | POA: Diagnosis not present

## 2022-12-22 DIAGNOSIS — D696 Thrombocytopenia, unspecified: Secondary | ICD-10-CM | POA: Diagnosis not present

## 2023-01-12 ENCOUNTER — Emergency Department (HOSPITAL_COMMUNITY)
Admission: EM | Admit: 2023-01-12 | Discharge: 2023-01-12 | Disposition: A | Discharge status: Home / Self Care | Payer: BC Managed Care – PPO | Attending: Emergency Medicine | Admitting: Emergency Medicine

## 2023-01-12 ENCOUNTER — Other Ambulatory Visit: Payer: Self-pay

## 2023-01-12 DIAGNOSIS — M778 Other enthesopathies, not elsewhere classified: Secondary | ICD-10-CM | POA: Diagnosis not present

## 2023-01-12 DIAGNOSIS — M779 Enthesopathy, unspecified: Secondary | ICD-10-CM | POA: Diagnosis not present

## 2023-01-12 DIAGNOSIS — M79641 Pain in right hand: Secondary | ICD-10-CM | POA: Diagnosis not present

## 2023-01-12 MED ORDER — MELOXICAM 15 MG PO TABS: 15.0000 mg | ORAL_TABLET | Freq: Every day | ORAL | 0 refills | Status: DC

## 2023-01-12 MED ORDER — DEXAMETHASONE SODIUM PHOSPHATE 10 MG/ML IJ SOLN
10.0000 mg | Freq: Once | INTRAMUSCULAR | Status: AC
  Administered 2023-01-12: 10 mg via INTRAMUSCULAR
  Filled 2023-01-12: qty 1

## 2023-01-12 NOTE — ED Triage Notes (Signed)
Pt c/o right hand stiffness x6 months.

## 2023-01-12 NOTE — ED Provider Notes (Signed)
[place] EMERGENCY DEPARTMENT AT [facility] Provider Note   CSN: [id number] Arrival date & time: 01/12/23  0539     History  Chief Complaint  Patient presents with   Hand Pain    Todd Reilly is a 57 y.o. male.  Patient presents for evaluation of progressively worsening pain in his right hand.  Patient reports pain with movement and decreased range of motion of his right second and third fingers.  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  Over the last 6 months or so he has noticed these changes.  These are his trigger fingers for when he paints.  Patient now cannot fully flex his right middle finger and he cannot fully straighten it out.       Home Medications Prior to Admission medications   Medication Sig Start Date End Date Taking? Authorizing Provider  meloxicam (MOBIC) 15 MG tablet Take 1 tablet (15 mg total) by mouth daily. 01/12/23  Yes Sarah Baez, Canary Brim, MD  ALPRAZolam Prudy Feeler) 1 MG tablet Take 1 mg by mouth 3 (three) times daily as needed for anxiety. 05/11/20   [provider]  Omega-3 Fatty Acids (FISH OIL) 1000 MG CAPS Take 1 capsule by mouth daily.    [provider]  omeprazole (PRILOSEC) 40 MG capsule Take 1 capsule (40 mg total) by mouth daily. 06/07/20   Lanelle Bal, DO      Allergies    Patient has no known allergies.    Review of Systems   Review of Systems  Physical Exam Updated Vital Signs BP 105/88   Pulse 91   Temp 98 F (36.7 C)   Resp 18   Ht 5\' 10"  (1.778 m)   Wt 83 kg   SpO2 94%   BMI 26.26 kg/m  Physical Exam Vitals and nursing note reviewed.  Constitutional:      Appearance: Normal appearance.  Musculoskeletal:     Right hand: Deformity (Sequela of previous fifth metacarpal fracture noted) present. Decreased range of motion (Cannot fully flex middle finger, difficulty fully extending).     Comments: Negative Tinel, negative Phalen, negative Finkelstein No thenar wasting  Skin:     General: Skin is warm and dry.  Neurological:     Mental Status: He is alert.     Sensory: Sensation is intact.     Motor: Motor function is intact.     ED Results / Procedures / Treatments   Labs (all labs ordered are listed, but only abnormal results are displayed) Labs Reviewed - No data to display  EKG None  Radiology No results found.  Procedures Procedures    Medications Ordered in ED Medications  dexamethasone (DECADRON) injection 10 mg (10 mg Intramuscular Given 01/12/23 0617)    ED Course/ Medical Decision Making/ A&P                             Medical Decision Making Risk Prescription drug management.   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  He has now lost some of the range of motion of the second finger and a fair amount of range of motion of the third finger.  There are no obvious findings of carpal tunnel, De Quervain's.  Suspect  severe tendinitis secondary to repetitive motion.  Attempted to splint finger to allow it to relax while he is performing his job.  Decadron here, Mobic daily.  Follow-up with orthopedics.        Final Clinical Impression(s) / ED Diagnoses Final diagnoses:  Tendinitis    Rx / DC Orders ED Discharge Orders          Ordered    meloxicam (MOBIC) 15 MG tablet  Daily        01/12/23 0619              Gilda Crease, MD 01/12/23 [phone]

## 2023-02-28 DIAGNOSIS — F419 Anxiety disorder, unspecified: Secondary | ICD-10-CM | POA: Diagnosis not present

## 2023-02-28 DIAGNOSIS — D696 Thrombocytopenia, unspecified: Secondary | ICD-10-CM | POA: Diagnosis not present

## 2023-02-28 DIAGNOSIS — E538 Deficiency of other specified B group vitamins: Secondary | ICD-10-CM | POA: Diagnosis not present

## 2023-02-28 DIAGNOSIS — R252 Cramp and spasm: Secondary | ICD-10-CM | POA: Diagnosis not present

## 2023-02-28 DIAGNOSIS — E663 Overweight: Secondary | ICD-10-CM | POA: Diagnosis not present

## 2023-02-28 DIAGNOSIS — Z6825 Body mass index (BMI) 25.0-25.9, adult: Secondary | ICD-10-CM | POA: Diagnosis not present

## 2023-04-16 ENCOUNTER — Ambulatory Visit: Payer: BC Managed Care – PPO | Admitting: Orthopedic Surgery

## 2023-04-16 ENCOUNTER — Encounter: Payer: Self-pay | Admitting: Orthopedic Surgery

## 2023-04-16 VITALS — BP 144/91 | HR 77 | Ht 70.0 in | Wt 180.0 lb

## 2023-04-16 DIAGNOSIS — Z01818 Encounter for other preprocedural examination: Secondary | ICD-10-CM | POA: Diagnosis not present

## 2023-04-16 DIAGNOSIS — M65331 Trigger finger, right middle finger: Secondary | ICD-10-CM | POA: Diagnosis not present

## 2023-04-16 NOTE — Addendum Note (Signed)
Addended byCaffie Damme on: 04/16/2023 04:53 PM   Modules accepted: Orders

## 2023-04-16 NOTE — Progress Notes (Signed)
Patient ID: SHONNON MCFARLEN, male   DOB: Feb 24, 1966, 57 y.o.   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 male.  Follow-up from the emergency room presented with a locked trigger finger right longer middle finger complains of pain over the A1 pulley he cannot flex the finger he can extend it with pain he has ulcers cannot really take anti-inflammatories  Review of Systems Review of Systems-negative  Past Medical History:  Diagnosis Date   GAD (generalized anxiety disorder)    Inguinal hernia     Past Surgical History:  Procedure Laterality Date   BIOPSY  06/07/2020   Procedure: BIOPSY;  Surgeon: Lanelle Bal, DO;  Location: [facility];  Service: Endoscopy;;   COLONOSCOPY WITH PROPOFOL N/A 06/07/2020   Procedure: COLONOSCOPY WITH PROPOFOL;  Surgeon: Lanelle Bal, DO;  Location: [facility];  Service: Endoscopy;  Laterality: N/A;  10:00am   ESOPHAGOGASTRODUODENOSCOPY (EGD) WITH PROPOFOL N/A 06/07/2020   Procedure: ESOPHAGOGASTRODUODENOSCOPY (EGD) WITH PROPOFOL;  Surgeon: Lanelle Bal, DO;  Location: [facility];  Service: Endoscopy;  Laterality: N/A;   HEMORRHOID SURGERY  09/22/2021   Procedure: EXCISION OF HEMROIDAL SKIN TAG;  Surgeon: Lucretia Roers, MD;  Location: [facility] ORS;  Service: General;;   INCISION AND DRAINAGE PERIRECTAL ABSCESS N/A 09/22/2021   Procedure: IRRIGATION OF PERI-ANAL ABCESS WITH PENROSE PLACEMENT;  Surgeon: Lucretia Roers, MD;  Location: [facility] OR[facility];  Service: General;  Laterality: N/A;   INGUINAL HERNIA REPAIR Left 02/14/2013   Procedure: HERNIA REPAIR INGUINAL INCARCERATED;  Surgeon: Fabio Bering, MD;  Location: [facility];  Service: General;   Laterality: Left;   INSERTION OF MESH Left 02/14/2013   Procedure: INSERTION OF MESH;  Surgeon: Fabio Bering, MD;  Location: [facility];  Service: General;  Laterality: Left;   OMENTECTOMY N/A 02/14/2013   Procedure: PARTIAL OMENTECTOMY;  Surgeon: Fabio Bering, MD;  Location: [facility] OR[facility];  Service: General;  Laterality: N/A;    Family History  Problem Relation Age of Onset   Lung cancer Mother    Lung cancer Father    Lung cancer Brother     Social History Social History   Tobacco Use   Smoking status: Former   Smokeless tobacco: Never   Tobacco comments:    quit 2005/2006  Vaping Use   Vaping status: Never Used  Substance Use Topics   Alcohol use: Yes    Comment: occas   Drug use: No    No Known Allergies  Current Outpatient Medications  Medication Sig Dispense Refill   ALPRAZolam (XANAX) 1 MG tablet Take 1 mg by mouth 3 (three) times daily as needed for anxiety.     Omega-3 Fatty Acids (FISH OIL) 1000 MG CAPS Take 1 capsule by mouth daily.     No current facility-administered medications for this visit.     Physical Exam BP (!) 144/91   Pulse 77   Ht 5\' 10"  (1.778 m)   Wt 180 lb (81.6 kg)   BMI 25.83 kg/m  Body mass index is 25.83 kg/m.  Appearance, there are no abnormalities in terms of appearance the patient was  well-developed and well-nourished. The grooming and hygiene were normal.  Mental status orientation, there was normal alertness and orientation Mood pleasant Ambulatory status normal with no assistive devices  Examination of the right hand reveals tenderness over the A1 pulley of the long/middle finger Range of motion remains normal with clicking on flexion extension.  Stability tests show no abnormality of the IP joints are MP joint. The FDP and FDS strength is normal Skin warm dry and intact without laceration or ulceration or erythema Neurologic examination normal sensation Vascular examination normal pulses with warm extremity and normal  capillary ref   MEDICAL DECISION MAKING  A.  Encounter Diagnosis  Name Primary?   Trigger finger, right middle finger Yes    B. DATA ANALYSED:  ER report  IMAGING: Independent interpretation of images: none  Orders: Surgery right hand long/middle finger trigger finger release  Outside records reviewed: ER records  C. MANAGEMENT surgery  No orders of the defined types were placed in this encounter.

## 2023-04-16 NOTE — Addendum Note (Signed)
Addended byCaffie Damme on: 04/16/2023 04:54 PM   Modules accepted: Orders

## 2023-04-27 ENCOUNTER — Encounter (HOSPITAL_COMMUNITY): Payer: Self-pay | Admitting: *Deleted

## 2023-04-27 ENCOUNTER — Other Ambulatory Visit: Payer: Self-pay

## 2023-04-27 ENCOUNTER — Emergency Department (HOSPITAL_COMMUNITY)
Admission: EM | Admit: 2023-04-27 | Discharge: 2023-04-27 | Disposition: A | Discharge status: Home / Self Care | Payer: BC Managed Care – PPO | Attending: Emergency Medicine | Admitting: Emergency Medicine

## 2023-04-27 DIAGNOSIS — R509 Fever, unspecified: Secondary | ICD-10-CM | POA: Diagnosis not present

## 2023-04-27 DIAGNOSIS — U071 COVID-19: Secondary | ICD-10-CM | POA: Insufficient documentation

## 2023-04-27 LAB — RESP PANEL BY RT-PCR (RSV, FLU A&B, COVID)  RVPGX2
Influenza A by PCR: NEGATIVE
Influenza B by PCR: NEGATIVE
Resp Syncytial Virus by PCR: NEGATIVE
SARS Coronavirus 2 by RT PCR: POSITIVE — AB

## 2023-04-27 NOTE — Discharge Instructions (Signed)
You have tested positive for Covid-19  Please wear a mask and quarantine. You may discontinue when: - It has been at least 5 days have passed since symptoms 1st appeared. - AND have had resolution of fever without the use of fever-reducing medications for 24 hours  Please wash hands frequently and do not share eating utensils or dishware with others.  You may use over-the-counter medications as needed for symptom control.   Return to the ER if you have shortness of breath to where you cannot talk in full sentences, chest pain, any other new or concerning symptoms.

## 2023-04-27 NOTE — ED Provider Notes (Signed)
[place] EMERGENCY DEPARTMENT AT [facility] Provider Note   CSN: [id number] Arrival date & time: 04/27/23  1508     History  Chief Complaint  Patient presents with   Generalized Body Aches    Todd Reilly is a 57 y.o. male with no significant past medical history who presents with concern for body aches, fever and chills, fatigue that started yesterday.  States the symptoms hit him suddenly when he got home and then he has been sleeping since due to fatigue.  Reports sweating through his clothes.  Denies any difficulty breathing, shortness of breath, chest pain.  Reports taking Tylenol at home.   HPI     Home Medications Prior to Admission medications   Medication Sig Start Date End Date Taking? Authorizing Provider  ALPRAZolam Prudy Feeler) 1 MG tablet Take 1 mg by mouth 3 (three) times daily as needed for anxiety. 05/11/20   [provider]  Omega-3 Fatty Acids (FISH OIL) 1000 MG CAPS Take 1 capsule by mouth daily.    [provider]      Allergies    Patient has no known allergies.    Review of Systems   Review of Systems  Constitutional:  Positive for chills.    Physical Exam Updated Vital Signs BP 120/85 (BP Location: Right Arm)   Pulse 67   Temp 98.5 F (36.9 C) (Oral)   Resp 16   Ht 5\' 10"  (1.778 m)   Wt 81.6 kg   SpO2 100%   BMI 25.83 kg/m  Physical Exam Vitals and nursing note reviewed.  Constitutional:      General: He is not in acute distress.    Appearance: He is well-developed.  HENT:     Head: Normocephalic and atraumatic.  Eyes:     Conjunctiva/sclera: Conjunctivae normal.  Cardiovascular:     Rate and Rhythm: Normal rate and regular rhythm.     Heart sounds: No murmur heard. Pulmonary:     Effort: Pulmonary effort is normal. No respiratory distress.     Breath sounds: Normal breath sounds.     Comments: Talking in full sentences Abdominal:     Palpations: Abdomen is soft.     Tenderness: There is no abdominal  tenderness.  Musculoskeletal:        General: No swelling.     Cervical back: Neck supple.  Skin:    General: Skin is warm and dry.     Capillary Refill: Capillary refill takes less than 2 seconds.  Neurological:     Mental Status: He is alert.  Psychiatric:        Mood and Affect: Mood normal.     ED Results / Procedures / Treatments   Labs (all labs ordered are listed, but only abnormal results are displayed) Labs Reviewed  RESP PANEL BY RT-PCR (RSV, FLU A&B, COVID)  RVPGX2 - Abnormal; Notable for the following components:      Result Value   SARS Coronavirus 2 by RT PCR POSITIVE (*)    All other components within normal limits    EKG None  Radiology No results found.  Procedures Procedures    Medications Ordered in ED Medications - No data to display  ED Course/ Medical Decision Making/ A&P                                 Medical Decision Making  57 y.o. male with no significant  past medical history presents to the ED for concern of fever, chills, body aches, fatigue that started yesterday  Differential diagnosis includes but is not limited to URI, COVID, RSV, flu, pneumonia  ED Course:  Patient found to be COVID-positive.  He is overall well-appearing, no shortness of breath.  Lungs clear to auscultation bilaterally, no fever or vital sign abnormalities, no concern for pneumonia at this time.  He is satting 100% on room air.   He has no significant past medical history, not high risk for severe infection, will defer starting Paxlovid at this time.  I Talked to patient about quarantining for the 5 days and must be fever free for 48 hours until he returns to normal daily activities.  Discussed the use over-the-counter medication for symptom control.  Patient appropriate for discharge home at this time.  Impression: COVID-positive  Disposition:  The patient was discharged home with instructions to quarantine for the first 5 days of symptoms, over-the-counter  medications as needed. Return precautions given.  Lab Tests: I Ordered, and personally interpreted labs.  The pertinent results include:   COVID-positive Flu, RSV negative            Final Clinical Impression(s) / ED Diagnoses Final diagnoses:  COVID-19    Rx / DC Orders ED Discharge Orders     None         Arabella Merles, Cordelia Poche 04/27/23 Halford Chessman, MD 05/01/23 1918

## 2023-04-27 NOTE — ED Triage Notes (Signed)
Pt with chills last night, woke up with damp sheets.   +body aches+ nausea, denies emesis

## 2023-04-27 NOTE — ED Notes (Signed)
Pa eval prior to nsg assessment. nad

## 2023-05-18 DIAGNOSIS — Z6825 Body mass index (BMI) 25.0-25.9, adult: Secondary | ICD-10-CM | POA: Diagnosis not present

## 2023-05-18 DIAGNOSIS — E663 Overweight: Secondary | ICD-10-CM | POA: Diagnosis not present

## 2023-05-18 DIAGNOSIS — Z23 Encounter for immunization: Secondary | ICD-10-CM | POA: Diagnosis not present

## 2023-05-18 DIAGNOSIS — F419 Anxiety disorder, unspecified: Secondary | ICD-10-CM | POA: Diagnosis not present

## 2023-05-21 ENCOUNTER — Telehealth: Payer: Self-pay | Admitting: Radiology

## 2023-05-21 NOTE — Telephone Encounter (Signed)
Todd Reilly  Todd Reilly W, RT I have scheduled his PAT for 10/25 at 3:15.

## 2023-05-21 NOTE — Telephone Encounter (Signed)
I will call him about the preop Todd Reilly can't reach him

## 2023-05-21 NOTE — Telephone Encounter (Signed)
-----   Message from Anabel Bene sent at 05/21/2023 12:18 PM EDT ----- Elvina Sidle,  I have left several messages for this pt to schedule his PAT and he has not returned my calls.  I am off the rest of the week after tomorrow.  Can I give you a PAT and let you see if you have better luck than me?  Thanks,

## 2023-05-22 NOTE — Telephone Encounter (Signed)
Left message for him to call me back so I can let him know about the preop

## 2023-05-23 ENCOUNTER — Telehealth: Payer: Self-pay | Admitting: Orthopedic Surgery

## 2023-05-23 ENCOUNTER — Telehealth: Payer: Self-pay | Admitting: Radiology

## 2023-05-23 NOTE — Telephone Encounter (Signed)
-----   Message from Barnie Alderman D sent at 05/23/2023 10:58 AM EDT ----- Hi Natascha Edmonds,  I pulled him into the [place] and canceled his PAT appt for 05/25/23.  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  He is wanting to push his surgery to around 12/29 so he has time to get his bills paid.  He said the best time to call him is after 2:30-3pm.  [phone]    Eber Jones , I will call him to schedule and let you know date, when you return next week will you pull into depo?  To Cleatis Polka he is going to be cancelled / RS from Oct 29th to end of December

## 2023-05-23 NOTE — Telephone Encounter (Signed)
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  He is wanting to push his surgery to around 12/29 so he has time to get his bills paid.  He said the best time to call him is after 2:30-3pm.  [phone]

## 2023-05-23 NOTE — Telephone Encounter (Signed)
Left message for him to call back may have to look at Jan

## 2023-05-25 ENCOUNTER — Encounter (HOSPITAL_COMMUNITY): Payer: BC Managed Care – PPO

## 2023-05-28 ENCOUNTER — Telehealth: Payer: Self-pay | Admitting: Orthopedic Surgery

## 2023-05-28 NOTE — Telephone Encounter (Signed)
Left message for him to call back to RS  11/29/ is not a surgery day

## 2023-05-28 NOTE — Telephone Encounter (Signed)
No that is not a surgery day I will call him again.   I told him we may need to look in Dec Jan

## 2023-05-28 NOTE — Telephone Encounter (Signed)
DR. Romeo Apple   Patient called at 12:11 PM LVM he wants to confirm his appt 06/29/23 for hand surgery.

## 2023-05-29 NOTE — Telephone Encounter (Signed)
07/03/23 rescheduled  Let Todd Reilly know.

## 2023-06-20 ENCOUNTER — Telehealth: Payer: Self-pay | Admitting: Orthopedic Surgery

## 2023-06-20 NOTE — Telephone Encounter (Signed)
Yes. He should hear something from preop soon, if not I gave him the number in voicemial

## 2023-06-20 NOTE — Telephone Encounter (Signed)
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

## 2023-06-21 ENCOUNTER — Telehealth: Payer: Self-pay | Admitting: Orthopedic Surgery

## 2023-06-21 NOTE — Telephone Encounter (Signed)
DR. Romeo Apple   Patient called and wants to know if his surgery is still on schedule for December 3rd .  Also he needs to talk about his short term disability.    He has called yesterday to and spoke with [facility] and Amy.    Please call him back at [phone]

## 2023-06-22 ENCOUNTER — Telehealth: Payer: Self-pay

## 2023-06-22 NOTE — Telephone Encounter (Signed)
Patient called asking for someone to give him a call in regards to his surgery, he has some questions. He is off of work today and would appreciate a return call at [phone]

## 2023-06-22 NOTE — Telephone Encounter (Signed)
I have already left message for him that it is   I called again left another message. Also advised him that he can drop off forms bring $25  I can not get him by phone, tried multiple times, he asked for call in afternoon, did that too last time he still didn't answer. Hopefully he will get the message.

## 2023-06-22 NOTE — Telephone Encounter (Signed)
I finally got him.

## 2023-06-27 NOTE — Patient Instructions (Signed)
Todd Reilly  06/27/2023     @PREFPERIOPPHARMACY @   Your procedure is scheduled on 07/03/2023.   Report to [facility] at  0830  A.M.   Call this number if you have problems the morning of surgery:  [phone]  If you experience any cold or flu symptoms such as cough, fever, chills, shortness of breath, etc. between now and your scheduled surgery, please notify us at the above number.   Remember:  Do not eat after midnight.   You may drink clear liquids until 0630 am on 07/03/2023.    Clear liquids allowed are:                    Water, Juice (No red color; non-citric and without pulp; diabetics please choose diet or no sugar options), Carbonated beverages (diabetics please choose diet or no sugar options), Clear Tea (No creamer, milk, or cream, including half & half and powdered creamer), Black Coffee Only (No creamer, milk or cream, including half & half and powdered creamer), and Clear Sports drink (No red color; diabetics please choose diet or no sugar options)    Take these medicines the morning of surgery with A SIP OF WATER                               alprazolam(if needed).    Do not wear jewelry, make-up or nail polish, including gel polish,  artificial nails, or any other type of covering on natural nails (fingers and  toes).  Do not wear lotions, powders, or perfumes, or deodorant.  Do not shave 48 hours prior to surgery.  Men may shave face and neck.  Do not bring valuables to the hospital.  [facility] is not responsible for any belongings or valuables.  Contacts, dentures or bridgework may not be worn into surgery.  Leave your suitcase in the car.  After surgery it may be brought to your room.  For patients admitted to the hospital, discharge time will be determined by your treatment team.  Patients discharged the day of surgery will not be allowed to drive home and must have someone with them for 24 hours.    Special instructions:   DO NOT smoke  tobacco or vape for 24 hours before your procedure.  Please read over the following fact sheets that you were given. Coughing and Deep Breathing, Surgical Site Infection Prevention, Anesthesia Post-op Instructions, and Care and Recovery After Surgery       Trigger Finger Release, Care After After trigger finger release, it is common to have: Stiffness. Soreness. Swelling. Follow these instructions at home: Incision care  Keep the compression bandage on for 48 hours or as told by your health care provider. After removing it, follow instructions about how to take care of your incision. Make sure you: Wash your hands with soap and water for at least 20 seconds before and after you change your bandage (dressing). If soap and water are not available, use hand sanitizer. Change your dressing as told by your provider. Leave stitches (sutures), skin glue, or tape strips in place. These skin closures may need to stay in place for 2 weeks or longer. If tape strip edges start to loosen and curl up, you may trim the loose edges. Do not remove tape strips completely unless your provider tells you to do that. Keep your hand and dressing clean and  dry. Check your incision area every day for signs of infection. Check for: More redness, swelling, or pain. Fluid or blood. Warmth. Pus or a bad smell. Bathing Do not take baths, swim, or use a hot tub until your provider approves. Keep your dressing dry until your provider says it can be removed. Cover it with a watertight covering when you take a bath or a shower. Managing pain, stiffness, and swelling  If told, put ice on your palm. Put ice in a plastic bag. Place a towel between your skin and the bag. Leave the ice on for 20 minutes, 2-3 times a day. If your skin turns bright red, remove the ice right away to prevent skin damage. The risk of damage is higher if you cannot feel pain, heat, or cold. Move your fingers often to reduce stiffness and  swelling. Raise (elevate) your hand above the level of your heart while you are sitting or lying down. Activity If you were given a sedative during the procedure, it can affect you for several hours. Do not drive or operate machinery until your provider says that it is safe. You may have to avoid lifting. Ask your provider how much you can safely lift. Avoid any activity that causes pain. It may take 4-6 months for stiffness to go away. Return to your normal activities as told by your provider. Ask your provider what activities are safe for you. If hand therapy was prescribed, do exercises as told. This will help you regain movement. General instructions Take over-the-counter and prescription medicines only as told by your provider. Do not use any products that contain nicotine or tobacco. These products include cigarettes, chewing tobacco, and vaping devices, such as e-cigarettes. If you need help quitting, ask your provider. Keep all follow-up visits. If you have sutures, these will be removed in about 10-14 days. Your health care provider may give you more instructions. Make sure you know what you can and cannot do. Contact a health care provider if: You have chills or fever. You have any signs of infection. You are unable to move your finger because of pain or stiffness. You have any tingling or numbness in your hand or fingers. This information is not intended to replace advice given to you by your health care provider. Make sure you discuss any questions you have with your health care provider. Document Revised: 02/28/2022 Document Reviewed: 02/28/2022 Elsevier Patient Education  2024 [employer] Monitored Anesthesia Care, Care After The following information offers guidance on how to care for yourself after your procedure. Your health care provider may also give you more specific instructions. If you have problems or questions, contact your health care provider. What can I expect  after the procedure? After the procedure, it is common to have: Tiredness. Little or no memory about what happened during or after the procedure. Impaired judgment when it comes to making decisions. Nausea or vomiting. Some trouble with balance. Follow these instructions at home: For the time period you were told by your health care provider:  Rest. Do not participate in activities where you could fall or become injured. Do not drive or use machinery. Do not drink alcohol. Do not take sleeping pills or medicines that cause drowsiness. Do not make important decisions or sign legal documents. Do not take care of children on your own. Medicines Take over-the-counter and prescription medicines only as told by your health care provider. If you were prescribed antibiotics, take them as told by your health care  provider. Do not stop using the antibiotic even if you start to feel better. Eating and drinking Follow instructions from your health care provider about what you may eat and drink. Drink enough fluid to keep your urine pale yellow. If you vomit: Drink clear fluids slowly and in small amounts as you are able. Clear fluids include water, ice chips, low-calorie sports drinks, and fruit juice that has water added to it (diluted fruit juice). Eat light and bland foods in small amounts as you are able. These foods include bananas, applesauce, rice, lean meats, toast, and crackers. General instructions  Have a responsible adult stay with you for the time you are told. It is important to have someone help care for you until you are awake and alert. If you have sleep apnea, surgery and some medicines can increase your risk for breathing problems. Follow instructions from your health care provider about wearing your sleep device: When you are sleeping. This includes during daytime naps. While taking prescription pain medicines, sleeping medicines, or medicines that make you drowsy. Do not use  any products that contain nicotine or tobacco. These products include cigarettes, chewing tobacco, and vaping devices, such as e-cigarettes. If you need help quitting, ask your health care provider. Contact a health care provider if: You feel nauseous or vomit every time you eat or drink. You feel light-headed. You are still sleepy or having trouble with balance after 24 hours. You get a rash. You have a fever. You have redness or swelling around the IV site. Get help right away if: You have trouble breathing. You have new confusion after you get home. These symptoms may be an emergency. Get help right away. Call 911. Do not wait to see if the symptoms will go away. Do not drive yourself to the hospital. This information is not intended to replace advice given to you by your health care provider. Make sure you discuss any questions you have with your health care provider. Document Revised: 12/12/2021 Document Reviewed: 12/12/2021 Elsevier Patient Education  2024 Else[employer] Inc. How to Use Chlorhexidine Before Surgery Chlorhexidine gluconate (CHG) is a germ-killing (antiseptic) solution that is used to clean the skin. It can get rid of the bacteria that normally live on the skin and can keep them away for about 24 hours. To clean your skin with CHG, you may be given: A CHG solution to use in the shower or as part of a sponge bath. A prepackaged cloth that contains CHG. Cleaning your skin with CHG may help lower the risk for infection: While you are staying in the intensive care unit of the hospital. If you have a vascular access, such as a central line, to provide short-term or long-term access to your veins. If you have a catheter to drain urine from your bladder. If you are on a ventilator. A ventilator is a machine that helps you breathe by moving air in and out of your lungs. After surgery. What are the risks? Risks of using CHG include: A skin reaction. Hearing loss, if CHG gets in  your ears and you have a perforated eardrum. Eye injury, if CHG gets in your eyes and is not rinsed out. The CHG product catching fire. Make sure that you avoid smoking and flames after applying CHG to your skin. Do not use CHG: If you have a chlorhexidine allergy or have previously reacted to chlorhexidine. On babies younger than 79 months of age. How to use CHG solution Use CHG only as told  by your health care provider, and follow the instructions on the label. Use the full amount of CHG as directed. Usually, this is one bottle. During a shower Follow these steps when using CHG solution during a shower (unless your health care provider gives you different instructions): Start the shower. Use your normal soap and shampoo to wash your face and hair. Turn off the shower or move out of the shower stream. Pour the CHG onto a clean washcloth. Do not use any type of brush or rough-edged sponge. Starting at your neck, lather your body down to your toes. Make sure you follow these instructions: If you will be having surgery, pay special attention to the part of your body where you will be having surgery. Scrub this area for at least 1 minute. Do not use CHG on your head or face. If the solution gets into your ears or eyes, rinse them well with water. Avoid your genital area. Avoid any areas of skin that have broken skin, cuts, or scrapes. Scrub your back and under your arms. Make sure to wash skin folds. Let the lather sit on your skin for 1-2 minutes or as long as told by your health care provider. Thoroughly rinse your entire body in the shower. Make sure that all body creases and crevices are rinsed well. Dry off with a clean towel. Do not put any substances on your body afterward--such as powder, lotion, or perfume--unless you are told to do so by your health care provider. Only use lotions that are recommended by the manufacturer. Put on clean clothes or pajamas. If it is the night before your  surgery, sleep in clean sheets.  During a sponge bath Follow these steps when using CHG solution during a sponge bath (unless your health care provider gives you different instructions): Use your normal soap and shampoo to wash your face and hair. Pour the CHG onto a clean washcloth. Starting at your neck, lather your body down to your toes. Make sure you follow these instructions: If you will be having surgery, pay special attention to the part of your body where you will be having surgery. Scrub this area for at least 1 minute. Do not use CHG on your head or face. If the solution gets into your ears or eyes, rinse them well with water. Avoid your genital area. Avoid any areas of skin that have broken skin, cuts, or scrapes. Scrub your back and under your arms. Make sure to wash skin folds. Let the lather sit on your skin for 1-2 minutes or as long as told by your health care provider. Using a different clean, wet washcloth, thoroughly rinse your entire body. Make sure that all body creases and crevices are rinsed well. Dry off with a clean towel. Do not put any substances on your body afterward--such as powder, lotion, or perfume--unless you are told to do so by your health care provider. Only use lotions that are recommended by the manufacturer. Put on clean clothes or pajamas. If it is the night before your surgery, sleep in clean sheets. How to use CHG prepackaged cloths Only use CHG cloths as told by your health care provider, and follow the instructions on the label. Use the CHG cloth on clean, dry skin. Do not use the CHG cloth on your head or face unless your health care provider tells you to. When washing with the CHG cloth: Avoid your genital area. Avoid any areas of skin that have broken skin, cuts, or  scrapes. Before surgery Follow these steps when using a CHG cloth to clean before surgery (unless your health care provider gives you different instructions): Using the CHG cloth,  vigorously scrub the part of your body where you will be having surgery. Scrub using a back-and-forth motion for 3 minutes. The area on your body should be completely wet with CHG when you are done scrubbing. Do not rinse. Discard the cloth and let the area air-dry. Do not put any substances on the area afterward, such as powder, lotion, or perfume. Put on clean clothes or pajamas. If it is the night before your surgery, sleep in clean sheets.  For general bathing Follow these steps when using CHG cloths for general bathing (unless your health care provider gives you different instructions). Use a separate CHG cloth for each area of your body. Make sure you wash between any folds of skin and between your fingers and toes. Wash your body in the following order, switching to a new cloth after each step: The front of your neck, shoulders, and chest. Both of your arms, under your arms, and your hands. Your stomach and groin area, avoiding the genitals. Your right leg and foot. Your left leg and foot. The back of your neck, your back, and your buttocks. Do not rinse. Discard the cloth and let the area air-dry. Do not put any substances on your body afterward--such as powder, lotion, or perfume--unless you are told to do so by your health care provider. Only use lotions that are recommended by the manufacturer. Put on clean clothes or pajamas. Contact a health care provider if: Your skin gets irritated after scrubbing. You have questions about using your solution or cloth. You swallow any chlorhexidine. Call your local poison control center ([phone] in the [place] Get help right away if: Your eyes itch badly, or they become very red or swollen. Your skin itches badly and is red or swollen. Your hearing changes. You have trouble seeing. You have swelling or tingling in your mouth or throat. You have trouble breathing. These symptoms may represent a serious problem that is an emergency. Do  not wait to see if the symptoms will go away. Get medical help right away. Call your local emergency services (911 in the [place] Do not drive yourself to the hospital. Summary Chlorhexidine gluconate (CHG) is a germ-killing (antiseptic) solution that is used to clean the skin. Cleaning your skin with CHG may help to lower your risk for infection. You may be given CHG to use for bathing. It may be in a bottle or in a prepackaged cloth to use on your skin. Carefully follow your health care provider's instructions and the instructions on the product label. Do not use CHG if you have a chlorhexidine allergy. Contact your health care provider if your skin gets irritated after scrubbing. This information is not intended to replace advice given to you by your health care provider. Make sure you discuss any questions you have with your health care provider. Document Revised: 11/14/2021 Document Reviewed: 09/27/2020 Elsevier Patient Education  2023 [employer].

## 2023-07-02 ENCOUNTER — Encounter (HOSPITAL_COMMUNITY)
Admission: RE | Admit: 2023-07-02 | Discharge: 2023-07-02 | Disposition: A | Discharge status: Home / Self Care | Payer: BC Managed Care – PPO | Source: Ambulatory Visit | Attending: Orthopedic Surgery | Admitting: Orthopedic Surgery

## 2023-07-02 ENCOUNTER — Telehealth: Payer: Self-pay | Admitting: Orthopedic Surgery

## 2023-07-02 ENCOUNTER — Encounter (HOSPITAL_COMMUNITY): Payer: Self-pay

## 2023-07-02 DIAGNOSIS — Z01812 Encounter for preprocedural laboratory examination: Secondary | ICD-10-CM | POA: Insufficient documentation

## 2023-07-02 DIAGNOSIS — M65331 Trigger finger, right middle finger: Secondary | ICD-10-CM | POA: Insufficient documentation

## 2023-07-02 DIAGNOSIS — Z01818 Encounter for other preprocedural examination: Secondary | ICD-10-CM

## 2023-07-02 DIAGNOSIS — F411 Generalized anxiety disorder: Secondary | ICD-10-CM | POA: Diagnosis not present

## 2023-07-02 DIAGNOSIS — Z87891 Personal history of nicotine dependence: Secondary | ICD-10-CM | POA: Diagnosis not present

## 2023-07-02 LAB — BASIC METABOLIC PANEL WITH GFR
Anion gap: 11 (ref 5–15)
BUN: 16 mg/dL (ref 6–20)
CO2: 22 mmol/L (ref 22–32)
Calcium: 9.4 mg/dL (ref 8.9–10.3)
Chloride: 104 mmol/L (ref 98–111)
Creatinine, Ser: 0.78 mg/dL (ref 0.61–1.24)
GFR, Estimated: >60 mL/min
Glucose, Bld: 166 mg/dL — ABNORMAL HIGH (ref 70–99)
Potassium: 3.3 mmol/L — ABNORMAL LOW (ref 3.5–5.1)
Sodium: 137 mmol/L (ref 135–145)

## 2023-07-02 LAB — CBC WITH DIFFERENTIAL/PLATELET
Abs Immature Granulocytes: 0.03 10*3/uL (ref 0.00–0.07)
Basophils Absolute: 0 10*3/uL (ref 0.0–0.1)
Basophils Relative: 0 %
Eosinophils Absolute: 0.2 10*3/uL (ref 0.0–0.5)
Eosinophils Relative: 2 %
HCT: 43.3 % (ref 39.0–52.0)
Hemoglobin: 15 g/dL (ref 13.0–17.0)
Immature Granulocytes: 0 %
Lymphocytes Relative: 13 %
Lymphs Abs: 1.5 10*3/uL (ref 0.7–4.0)
MCH: 30.8 pg (ref 26.0–34.0)
MCHC: 34.6 g/dL (ref 30.0–36.0)
MCV: 88.9 fL (ref 80.0–100.0)
Monocytes Absolute: 0.4 10*3/uL (ref 0.1–1.0)
Monocytes Relative: 4 %
Neutro Abs: 9.1 10*3/uL — ABNORMAL HIGH (ref 1.7–7.7)
Neutrophils Relative %: 81 %
Platelets: 145 10*3/uL — ABNORMAL LOW (ref 150–400)
RBC: 4.87 MIL/uL (ref 4.22–5.81)
RDW: 12.4 % (ref 11.5–15.5)
WBC: 11.3 10*3/uL — ABNORMAL HIGH (ref 4.0–10.5)
nRBC: 0 % (ref 0.0–0.2)

## 2023-07-02 NOTE — Telephone Encounter (Signed)
Dr. 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  [phone]

## 2023-07-02 NOTE — H&P (Addendum)
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 male.  Follow-up from the emergency room presented with a locked trigger finger right longer middle finger complains of pain over the A1 pulley he cannot flex the finger he can extend it with pain he has ulcers cannot really take anti-inflammatories   Review of Systems Review of Systems-negative       Past Medical History:  Diagnosis Date   GAD (generalized anxiety disorder)     Inguinal hernia                 Past Surgical History:  Procedure Laterality Date   BIOPSY   06/07/2020    Procedure: BIOPSY;  Surgeon: Lanelle Bal, DO;  Location: [facility];  Service: Endoscopy;;   COLONOSCOPY WITH PROPOFOL N/A 06/07/2020    Procedure: COLONOSCOPY WITH PROPOFOL;  Surgeon: Lanelle Bal, DO;  Location: [facility];  Service: Endoscopy;  Laterality: N/A;  10:00am   ESOPHAGOGASTRODUODENOSCOPY (EGD) WITH PROPOFOL N/A 06/07/2020    Procedure: ESOPHAGOGASTRODUODENOSCOPY (EGD) WITH PROPOFOL;  Surgeon: Lanelle Bal, DO;  Location: [facility];  Service: Endoscopy;  Laterality: N/A;   HEMORRHOID SURGERY   09/22/2021    Procedure: EXCISION OF HEMROIDAL SKIN TAG;  Surgeon: Lucretia Roers, MD;  Location: [facility];  Service: General;;   INCISION AND DRAINAGE PERIRECTAL ABSCESS N/A 09/22/2021    Procedure: IRRIGATION OF PERI-ANAL ABCESS WITH PENROSE PLACEMENT;  Surgeon: Lucretia Roers, MD;  Location: [facility];  Service: General;  Laterality: N/A;    INGUINAL HERNIA REPAIR Left 02/14/2013    Procedure: HERNIA REPAIR INGUINAL INCARCERATED;  Surgeon: Fabio Bering, MD;  Location: [facility];  Service: General;  Laterality: Left;   INSERTION OF MESH Left 02/14/2013    Procedure: INSERTION OF MESH;  Surgeon: Fabio Bering, MD;  Location: [facility];  Service: General;  Laterality: Left;   OMENTECTOMY N/A 02/14/2013    Procedure: PARTIAL OMENTECTOMY;  Surgeon: Fabio Bering, MD;  Location: [facility];  Service: General;  Laterality: N/A;               Family History  Problem Relation Age of Onset   Lung cancer Mother     Lung cancer Father     Lung cancer Brother            Social History Social History  Social History         Tobacco Use   Smoking status: Former   Smokeless tobacco: Never   Tobacco comments:      quit 2005/2006  Vaping Use   Vaping status: Never Used  Substance Use Topics   Alcohol use: Yes      Comment: occas   Drug use: No        Allergies  No Known Allergies           Current Outpatient Medications  Medication Sig Dispense Refill  ALPRAZolam (XANAX) 1 MG tablet Take 1 mg by mouth 3 (three) times daily as needed for anxiety.       Omega-3 Fatty Acids (FISH OIL) 1000 MG CAPS Take 1 capsule by mouth daily.          No current facility-administered medications for this visit.          Physical Exam BP (!) 144/91   Pulse 77   Ht 5\' 10"  (1.778 m)   Wt 180 lb (81.6 kg)   BMI 25.83 kg/m  Body mass index is 25.83 kg/m.   Appearance, there are no abnormalities in terms of appearance the patient was well-developed and well-nourished. The grooming and hygiene were normal.   Mental status orientation, there was normal alertness and orientation Mood pleasant Ambulatory status normal with no assistive devices   Examination of the right hand reveals tenderness over the A1 pulley of the long/middle finger Range of motion remains normal with clicking on flexion extension.  Stability tests show no  abnormality of the IP joints are MP joint. The FDP and FDS strength is normal Skin warm dry and intact without laceration or ulceration or erythema Neurologic examination normal sensation Vascular examination normal pulses with warm extremity and normal capillary ref     MEDICAL DECISION MAKING   A.      Encounter Diagnosis  Name Primary?   Trigger finger, right middle finger Yes      B. DATA ANALYSED:   ER report  IMAGING: Independent interpretation of images: none  Orders: Surgery right hand long/middle finger trigger finger release  Outside records reviewed: ER records  C. MANAGEMENT surgery   No orders of the defined types were placed in this encounter.

## 2023-07-03 ENCOUNTER — Encounter (HOSPITAL_COMMUNITY): Payer: Self-pay | Admitting: Orthopedic Surgery

## 2023-07-03 ENCOUNTER — Encounter (HOSPITAL_COMMUNITY)
Admission: RE | Disposition: A | Discharge status: Home / Self Care | Payer: Self-pay | Source: Home / Self Care | Attending: Orthopedic Surgery

## 2023-07-03 ENCOUNTER — Ambulatory Visit (HOSPITAL_COMMUNITY)
Admission: RE | Admit: 2023-07-03 | Discharge: 2023-07-03 | Disposition: A | Discharge status: Home / Self Care | Payer: BC Managed Care – PPO | Attending: Orthopedic Surgery | Admitting: Orthopedic Surgery

## 2023-07-03 ENCOUNTER — Ambulatory Visit (HOSPITAL_COMMUNITY): Payer: BC Managed Care – PPO | Admitting: Anesthesiology

## 2023-07-03 ENCOUNTER — Ambulatory Visit (HOSPITAL_COMMUNITY): Payer: Self-pay | Admitting: Anesthesiology

## 2023-07-03 DIAGNOSIS — M65331 Trigger finger, right middle finger: Secondary | ICD-10-CM | POA: Diagnosis not present

## 2023-07-03 DIAGNOSIS — Z87891 Personal history of nicotine dependence: Secondary | ICD-10-CM | POA: Diagnosis not present

## 2023-07-03 DIAGNOSIS — F411 Generalized anxiety disorder: Secondary | ICD-10-CM | POA: Diagnosis not present

## 2023-07-03 HISTORY — PX: TRIGGER FINGER RELEASE: SHX641

## 2023-07-03 SURGERY — RELEASE, A1 PULLEY, FOR TRIGGER FINGER
Anesthesia: General | Site: Middle Finger | Laterality: Right

## 2023-07-03 MED ORDER — CEFAZOLIN SODIUM-DEXTROSE 2-4 GM/100ML-% IV SOLN
2.0000 g | INTRAVENOUS | Status: AC
  Administered 2023-07-03: 2 g via INTRAVENOUS

## 2023-07-03 MED ORDER — BUPIVACAINE HCL (PF) 0.5 % IJ SOLN
INTRAMUSCULAR | Status: AC
  Filled 2023-07-03: qty 30

## 2023-07-03 MED ORDER — PROPOFOL 10 MG/ML IV BOLUS
INTRAVENOUS | Status: AC
  Filled 2023-07-03: qty 20

## 2023-07-03 MED ORDER — SODIUM CHLORIDE 0.9 % IR SOLN
Status: DC | PRN
  Administered 2023-07-03: 1000 mL

## 2023-07-03 MED ORDER — PROPOFOL 500 MG/50ML IV EMUL
INTRAVENOUS | Status: AC
Start: 2023-07-03 — End: ?
  Filled 2023-07-03: qty 50

## 2023-07-03 MED ORDER — ORAL CARE MOUTH RINSE: 15.0000 mL | Freq: Once | OROMUCOSAL | Status: AC

## 2023-07-03 MED ORDER — LIDOCAINE HCL (PF) 1 % IJ SOLN
INTRAMUSCULAR | Status: AC
Start: 2023-07-03 — End: ?
  Filled 2023-07-03: qty 30

## 2023-07-03 MED ORDER — HYDROCODONE-ACETAMINOPHEN 5-325 MG PO TABS: 1.0000 | ORAL_TABLET | Freq: Four times a day (QID) | ORAL | 0 refills | Status: DC | PRN

## 2023-07-03 MED ORDER — CEFAZOLIN SODIUM-DEXTROSE 2-4 GM/100ML-% IV SOLN
INTRAVENOUS | Status: AC
  Filled 2023-07-03: qty 100

## 2023-07-03 MED ORDER — OXYCODONE HCL 5 MG/5ML PO SOLN: 5.0000 mg | Freq: Once | ORAL | Status: AC | PRN

## 2023-07-03 MED ORDER — PROPOFOL 10 MG/ML IV BOLUS
INTRAVENOUS | Status: DC | PRN
  Administered 2023-07-03: 40 mg via INTRAVENOUS
  Administered 2023-07-03: 60 mg via INTRAVENOUS
  Administered 2023-07-03: 150 mg via INTRAVENOUS
  Administered 2023-07-03: 40 mg via INTRAVENOUS

## 2023-07-03 MED ORDER — PROPOFOL 500 MG/50ML IV EMUL
INTRAVENOUS | Status: DC | PRN
  Administered 2023-07-03: 125 ug/kg/min via INTRAVENOUS

## 2023-07-03 MED ORDER — ONDANSETRON HCL 4 MG/2ML IJ SOLN
INTRAMUSCULAR | Status: DC | PRN
  Administered 2023-07-03: 4 mg via INTRAVENOUS

## 2023-07-03 MED ORDER — FENTANYL CITRATE (PF) 100 MCG/2ML IJ SOLN
INTRAMUSCULAR | Status: DC | PRN
  Administered 2023-07-03 (×2): 25 ug via INTRAVENOUS
  Administered 2023-07-03: 50 ug via INTRAVENOUS

## 2023-07-03 MED ORDER — LIDOCAINE HCL (CARDIAC) PF 100 MG/5ML IV SOSY
PREFILLED_SYRINGE | INTRAVENOUS | Status: DC | PRN
  Administered 2023-07-03: 60 mg via INTRAVENOUS

## 2023-07-03 MED ORDER — FENTANYL CITRATE (PF) 100 MCG/2ML IJ SOLN
INTRAMUSCULAR | Status: AC
  Filled 2023-07-03: qty 2

## 2023-07-03 MED ORDER — FENTANYL CITRATE PF 50 MCG/ML IJ SOSY
25.0000 ug | PREFILLED_SYRINGE | INTRAMUSCULAR | Status: DC | PRN
  Administered 2023-07-03 (×3): 50 ug via INTRAVENOUS
  Filled 2023-07-03 (×3): qty 1

## 2023-07-03 MED ORDER — CHLORHEXIDINE GLUCONATE 0.12 % MT SOLN
15.0000 mL | Freq: Once | OROMUCOSAL | Status: AC
  Administered 2023-07-03: 15 mL via OROMUCOSAL

## 2023-07-03 MED ORDER — OXYCODONE HCL 5 MG PO TABS
5.0000 mg | ORAL_TABLET | Freq: Once | ORAL | Status: AC | PRN
  Administered 2023-07-03: 5 mg via ORAL
  Filled 2023-07-03: qty 1

## 2023-07-03 MED ORDER — HYDROCODONE-ACETAMINOPHEN 5-325 MG PO TABS: 1.0000 | ORAL_TABLET | ORAL | Status: DC | PRN

## 2023-07-03 MED ORDER — LIDOCAINE HCL (PF) 2 % IJ SOLN
INTRAMUSCULAR | Status: AC
  Filled 2023-07-03: qty 5

## 2023-07-03 MED ORDER — ONDANSETRON HCL 4 MG/2ML IJ SOLN
INTRAMUSCULAR | Status: AC
Start: 2023-07-03 — End: ?
  Filled 2023-07-03: qty 2

## 2023-07-03 MED ORDER — BUPIVACAINE HCL (PF) 0.5 % IJ SOLN
INTRAMUSCULAR | Status: DC | PRN
  Administered 2023-07-03: 6 mL

## 2023-07-03 MED ORDER — LACTATED RINGERS IV SOLN: INTRAVENOUS | Status: DC

## 2023-07-03 MED ORDER — ONDANSETRON HCL 4 MG/2ML IJ SOLN: 4.0000 mg | Freq: Once | INTRAMUSCULAR | Status: DC | PRN

## 2023-07-03 SURGICAL SUPPLY — 35 items
BANDAGE ESMARK 4X12 BL STRL LF (DISPOSABLE) ×1 IMPLANT
BLADE SURG 15 STRL LF DISP TIS (BLADE) ×1 IMPLANT
BNDG COHESIVE 4X5 TAN STRL (GAUZE/BANDAGES/DRESSINGS) ×1 IMPLANT
BNDG ELASTIC 2X5.8 VLCR NS LF (GAUZE/BANDAGES/DRESSINGS) ×1 IMPLANT
BNDG ESMARK 4X12 BLUE STRL LF (DISPOSABLE) ×1
CHLORAPREP W/TINT 26 (MISCELLANEOUS) ×1 IMPLANT
CLOTH BEACON ORANGE TIMEOUT ST (SAFETY) ×1 IMPLANT
COVER LIGHT HANDLE STERIS (MISCELLANEOUS) ×2 IMPLANT
CUFF TOURN SGL QUICK 18X4 (TOURNIQUET CUFF) ×1 IMPLANT
DRAPE HALF SHEET 40X57 (DRAPES) ×1 IMPLANT
ELECT NDL TIP 2.8 STRL (NEEDLE) ×1 IMPLANT
ELECT NEEDLE TIP 2.8 STRL (NEEDLE) ×1
ELECT REM PT RETURN 9FT ADLT (ELECTROSURGICAL) ×1
ELECTRODE REM PT RTRN 9FT ADLT (ELECTROSURGICAL) ×1 IMPLANT
GAUZE 4X4 16PLY ~~LOC~~+RFID DBL (SPONGE) ×1 IMPLANT
GAUZE SPONGE 4X4 12PLY STRL (GAUZE/BANDAGES/DRESSINGS) ×1 IMPLANT
GAUZE XEROFORM 1X8 LF (GAUZE/BANDAGES/DRESSINGS) ×1 IMPLANT
GLOVE BIOGEL PI IND STRL 7.0 (GLOVE) ×2 IMPLANT
GLOVE SS N UNI LF 8.5 STRL (GLOVE) ×1 IMPLANT
GLOVE SURG POLYISO LF SZ8 (GLOVE) ×1 IMPLANT
GOWN STRL REUS W/TWL LRG LVL3 (GOWN DISPOSABLE) ×1 IMPLANT
GOWN STRL REUS W/TWL XL LVL3 (GOWN DISPOSABLE) ×1 IMPLANT
KIT TURNOVER KIT A (KITS) ×1 IMPLANT
MANIFOLD NEPTUNE II (INSTRUMENTS) ×1 IMPLANT
NDL HYPO 21X1.5 SAFETY (NEEDLE) ×1 IMPLANT
NEEDLE HYPO 21X1.5 SAFETY (NEEDLE) ×1
NS IRRIG 1000ML POUR BTL (IV SOLUTION) ×1 IMPLANT
PACK BASIC LIMB (CUSTOM PROCEDURE TRAY) ×1 IMPLANT
PAD ARMBOARD 7.5X6 YLW CONV (MISCELLANEOUS) ×1 IMPLANT
POSITIONER HAND ALUMI XLG (MISCELLANEOUS) ×1 IMPLANT
POSITIONER HEAD 8X9X4 ADT (SOFTGOODS) ×1 IMPLANT
SET BASIN LINEN APH (SET/KITS/TRAYS/PACK) ×1 IMPLANT
SPIKE FLUID TRANSFER (MISCELLANEOUS) IMPLANT
SUT ETHILON 3 0 FSL (SUTURE) ×1 IMPLANT
SYR CONTROL 10ML LL (SYRINGE) ×1 IMPLANT

## 2023-07-03 NOTE — Brief Op Note (Signed)
07/03/2023  10:54 AM  PATIENT:  Todd Reilly  57 y.o. male  PRE-OPERATIVE DIAGNOSIS:  locked trigger finger right middle  POST-OPERATIVE DIAGNOSIS:  locked trigger finger right middle  PROCEDURE:  Procedure(s): RELEASE TRIGGER FINGER/A-1 PULLEY (Right) middle/long finger   SURGEON:  Surgeons and Role:    * Vickki Hearing, MD - Primary  PHYSICIAN ASSISTANT:   ASSISTANTS: none   ANESTHESIA:   general -LMA   EBL:  none   BLOOD ADMINISTERED:none  DRAINS: none   LOCAL MEDICATIONS USED:  MARCAINE     SPECIMEN:  No Specimen  DISPOSITION OF SPECIMEN:  N/A  COUNTS:  YES  TOURNIQUET:   Total Tourniquet Time Documented: Upper Arm (Right) - 14 minutes Total: Upper Arm (Right) - 14 minutes   DICTATION: .Reubin Milan Dictation  PLAN OF CARE: Discharge to home after PACU  PATIENT DISPOSITION:  PACU - hemodynamically stable.   Delay start of Pharmacological VTE agent (>24hrs) due to surgical blood loss or risk of bleeding: not applicable

## 2023-07-03 NOTE — Op Note (Signed)
07/03/2023  10:54 AM  PATIENT:  Todd Reilly  57 y.o. male  PRE-OPERATIVE DIAGNOSIS:  locked trigger finger right middle  POST-OPERATIVE DIAGNOSIS:  locked trigger finger right middle  PROCEDURE:  Procedure(s): RELEASE TRIGGER FINGER/A-1 PULLEY (Right) middle/long finger   FINDINGS: Stenosing tenosynovitis right long/middle finger  SURGEON:  Surgeons and Role:    Vickki Hearing, MD - Primary  Details of surgery  Todd Reilly was evaluated in the preop area and cleared for surgery.  The surgical site was confirmed and marked chart review was completed  Right longer middle finger was marked as the surgical site  The patient was brought to the operating room and initially given sedation with the idea of using local anesthetic however the patient could not be calmed enough to get his arm in good position to perform the surgery and he was given LMA  After sterile prep and drape timeout was completed.  The limb was exsanguinated with a 4 inch Esmarch the tourniquet was elevated to 250 mmHg.  I chose a straight incision starting at the proximal portion of the A1 pulley and extending it distally.  Subcutaneous tissue was divided and then blunt dissection was carried out isolating the tendon and protecting the neurovascular structures.  A Freer elevator was used to get under the A1 pulley and this was released.  The tendons were then pulled out of the hand with a curved instrument and smooth gliding of the tendon was noted.  The wound was irrigated and closed with six 3-0 nylon sutures in interrupted fashion.  We injected Marcaine plain in the subcu tissue  We then applied a sterile dressing  PHYSICIAN ASSISTANT:   ASSISTANTS: none   ANESTHESIA:   general -LMA   EBL:  none   BLOOD ADMINISTERED:none  DRAINS: none   LOCAL MEDICATIONS USED:  MARCAINE     SPECIMEN:  No Specimen  DISPOSITION OF SPECIMEN:  N/A  COUNTS:  YES  TOURNIQUET:   Total Tourniquet Time  Documented: Upper Arm (Right) - 14 minutes Total: Upper Arm (Right) - 14 minutes   DICTATION: .Todd Reilly Dictation  PLAN OF CARE: Discharge to home after PACU  PATIENT DISPOSITION:  PACU - hemodynamically stable.   Delay start of Pharmacological VTE agent (>24hrs) due to surgical blood loss or risk of bleeding: not applicable   Postop plan sutures can be removed in 14 to 17 days  Patient can start early range of motion  Shower can be performed with a glove over the hand after 48 hours

## 2023-07-03 NOTE — Transfer of Care (Signed)
Immediate Anesthesia Transfer of Care Note  Patient: Todd Reilly  Procedure(s) Performed: RELEASE TRIGGER FINGER/A-1 PULLEY (Right: Middle Finger)  Patient Location: PACU  Anesthesia Type:General  Level of Consciousness: awake and patient cooperative  Airway & Oxygen Therapy: Patient Spontanous Breathing and Patient connected to nasal cannula oxygen  Post-op Assessment: Report given to RN and Post -op Vital signs reviewed and stable  Post vital signs: Reviewed and stable  Last Vitals:  Vitals Value Taken Time  BP 137/84 07/03/23 1103  Temp 98.4 07/03/23  1106  Pulse 62 07/03/23 1105  Resp 9 07/03/23 1105  SpO2 100 % 07/03/23 1105  Vitals shown include unfiled device data.  Last Pain:  Vitals:   07/03/23 0821  PainSc: 7       Patients Stated Pain Goal: 6 (07/03/23 6962)  Complications: No notable events documented.

## 2023-07-03 NOTE — Anesthesia Postprocedure Evaluation (Signed)
Anesthesia Post Note  Patient: Devion R Brock  Procedure(s) Performed: RELEASE TRIGGER FINGER/A-1 PULLEY (Right: Middle Finger)  Patient location during evaluation: PACU Anesthesia Type: General Level of consciousness: awake and alert Pain management: pain level controlled Vital Signs Assessment: post-procedure vital signs reviewed and stable Respiratory status: spontaneous breathing, nonlabored ventilation, respiratory function stable and patient connected to nasal cannula oxygen Cardiovascular status: blood pressure returned to baseline and stable Postop Assessment: no apparent nausea or vomiting Anesthetic complications: no   There were no known notable events for this encounter.   Last Vitals:  Vitals:   07/03/23 1130 07/03/23 1145  BP: (!) 144/86 125/79  Pulse: (!) 57 (!) 48  Resp: 11 14  Temp:    SpO2: 99% 98%    Last Pain:  Vitals:   07/03/23 1155  PainSc: 5                  Kennedee Kitzmiller L Labria Wos

## 2023-07-03 NOTE — Anesthesia Procedure Notes (Signed)
Date/Time: 07/03/2023 10:09 AM  Performed by: Franco Nones, CRNAPre-anesthesia Checklist: Patient identified, Emergency Drugs available, Suction available, Timeout performed and Patient being monitored Patient Re-evaluated:Patient Re-evaluated prior to induction Oxygen Delivery Method: Non-rebreather mask

## 2023-07-03 NOTE — Interval H&P Note (Signed)
History and Physical Interval Note:  07/03/2023 10:01 AM  Todd Reilly  has presented today for surgery, with the diagnosis of locked trigger finger right middle.  The various methods of treatment have been discussed with the patient and family. After consideration of risks, benefits and other options for treatment, the patient has consented to  Procedure(s): RELEASE TRIGGER FINGER/A-1 PULLEY (Right) middle or long finger as a surgical intervention.  The patient's history has been reviewed, patient examined, no change in status, stable for surgery.  I have reviewed the patient's chart and labs.  Questions were answered to the patient's satisfaction.     Fuller Canada

## 2023-07-03 NOTE — Anesthesia Preprocedure Evaluation (Signed)
Anesthesia Evaluation  Patient identified by MRN, date of birth, ID band Patient awake    Reviewed: Allergy & Precautions, NPO status , Patient's Chart, lab work & pertinent test results  Airway Mallampati: II  TM Distance: >3 FB Neck ROM: Full    Dental  (+) Chipped, Dental Advisory Given   Pulmonary neg pulmonary ROS, former smoker   Pulmonary exam normal breath sounds clear to auscultation       Cardiovascular negative cardio ROS Normal cardiovascular exam Rhythm:Regular Rate:Normal     Neuro/Psych  PSYCHIATRIC DISORDERS Anxiety     negative neurological ROS     GI/Hepatic Neg liver ROS,GERD  Medicated,,  Endo/Other  negative endocrine ROS    Renal/GU negative Renal ROS  negative genitourinary   Musculoskeletal negative musculoskeletal ROS (+)    Abdominal   Peds negative pediatric ROS (+)  Hematology negative hematology ROS (+)   Anesthesia Other Findings   Reproductive/Obstetrics negative OB ROS                             Anesthesia Physical Anesthesia Plan  ASA: 2  Anesthesia Plan: General   Post-op Pain Management:    Induction: Intravenous  PONV Risk Score and Plan: 2  Airway Management Planned: Nasal Cannula and Natural Airway  Additional Equipment: None  Intra-op Plan:   Post-operative Plan: Extubation in OR  Informed Consent: I have reviewed the patients History and Physical, chart, labs and discussed the procedure including the risks, benefits and alternatives for the proposed anesthesia with the patient or authorized representative who has indicated his/her understanding and acceptance.     Dental advisory given  Plan Discussed with: CRNA and Surgeon  Anesthesia Plan Comments:         Anesthesia Quick Evaluation

## 2023-07-06 ENCOUNTER — Encounter (HOSPITAL_COMMUNITY): Payer: Self-pay | Admitting: Orthopedic Surgery

## 2023-07-19 ENCOUNTER — Ambulatory Visit: Payer: BC Managed Care – PPO | Admitting: Orthopedic Surgery

## 2023-07-19 DIAGNOSIS — M65331 Trigger finger, right middle finger: Secondary | ICD-10-CM

## 2023-07-19 NOTE — Progress Notes (Signed)
  Postop appointment  Right long finger trigger release  Postop day 16  Encounter Diagnosis  Name Primary?   Trigger finger, right middle finger s/p release 07/03/23 Yes   Doing well except for stiffness all sutures were removed patient encouraged to continue range of motion exercises to complete full fist capability and then return to work 6 weeks postop  Follow-up as needed

## 2023-08-15 NOTE — Progress Notes (Signed)
   There were no vitals taken for this visit.  There is no height or weight on file to calculate BMI.  Chief Complaint  Patient presents with   Post-op Follow-up    Has stiffness hand and feels some pain at palm, states knot at tendon 07/03/23 surgery date right middle finger trigger releaseb    Encounter Diagnosis  Name Primary?   Trigger finger, right middle finger s/p release 07/03/23 Yes    DOI/DOS/ Date: 07/03/23   Still has stiffness/ soreness

## 2023-08-16 ENCOUNTER — Ambulatory Visit: Payer: BC Managed Care – PPO | Admitting: Orthopedic Surgery

## 2023-08-16 ENCOUNTER — Encounter: Payer: Self-pay | Admitting: Orthopedic Surgery

## 2023-08-16 DIAGNOSIS — M65331 Trigger finger, right middle finger: Secondary | ICD-10-CM

## 2023-08-16 NOTE — Progress Notes (Signed)
  Chief Complaint  Patient presents with   Post-op Follow-up    Has stiffness hand and feels some pain at palm, states knot at tendon 07/03/23 surgery date right middle finger trigger releaseb    Encounter Diagnosis  Name Primary?   Trigger finger, right middle finger s/p release 07/03/23 Yes    DOI/DOS/ Date: 07/03/23   Still has stiffness/ soreness   Overall doing well  Can't make full fist, and can't get finger fully straight actively.  There are no sins of infection   Passive ROM is normal  No triggering   Should be fine with aa-rom exercsies  F/u PRN

## 2023-09-07 DIAGNOSIS — Z6825 Body mass index (BMI) 25.0-25.9, adult: Secondary | ICD-10-CM | POA: Diagnosis not present

## 2023-09-07 DIAGNOSIS — F419 Anxiety disorder, unspecified: Secondary | ICD-10-CM | POA: Diagnosis not present

## 2023-09-07 DIAGNOSIS — E663 Overweight: Secondary | ICD-10-CM | POA: Diagnosis not present

## 2023-09-28 IMAGING — DX DG CHEST 1V PORT
2 series · 2 of 2 positions shown · non-contrast
Comparison: 07/19/2015

CLINICAL DATA: History of perianal abscess with COVID positivity

EXAM:
PORTABLE CHEST 1 VIEW

[chest ap (1 of 2)]
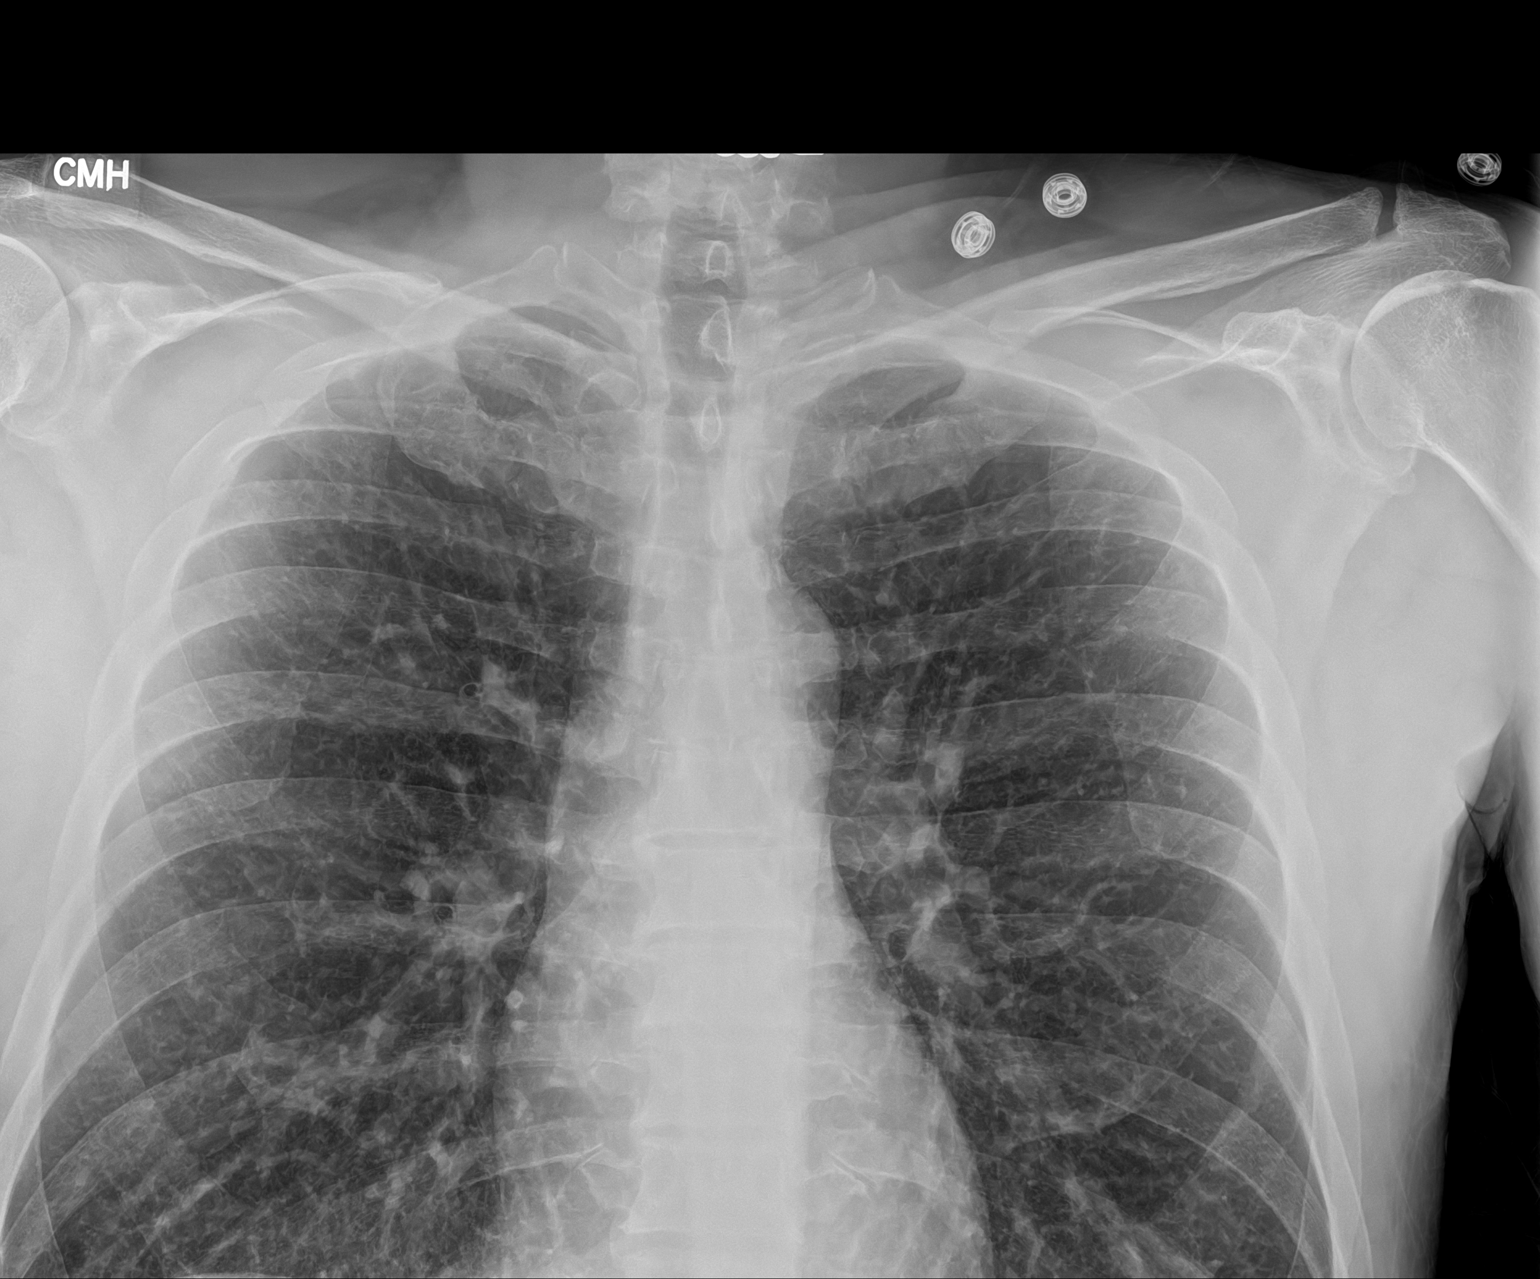

[chest ap (2 of 2)]
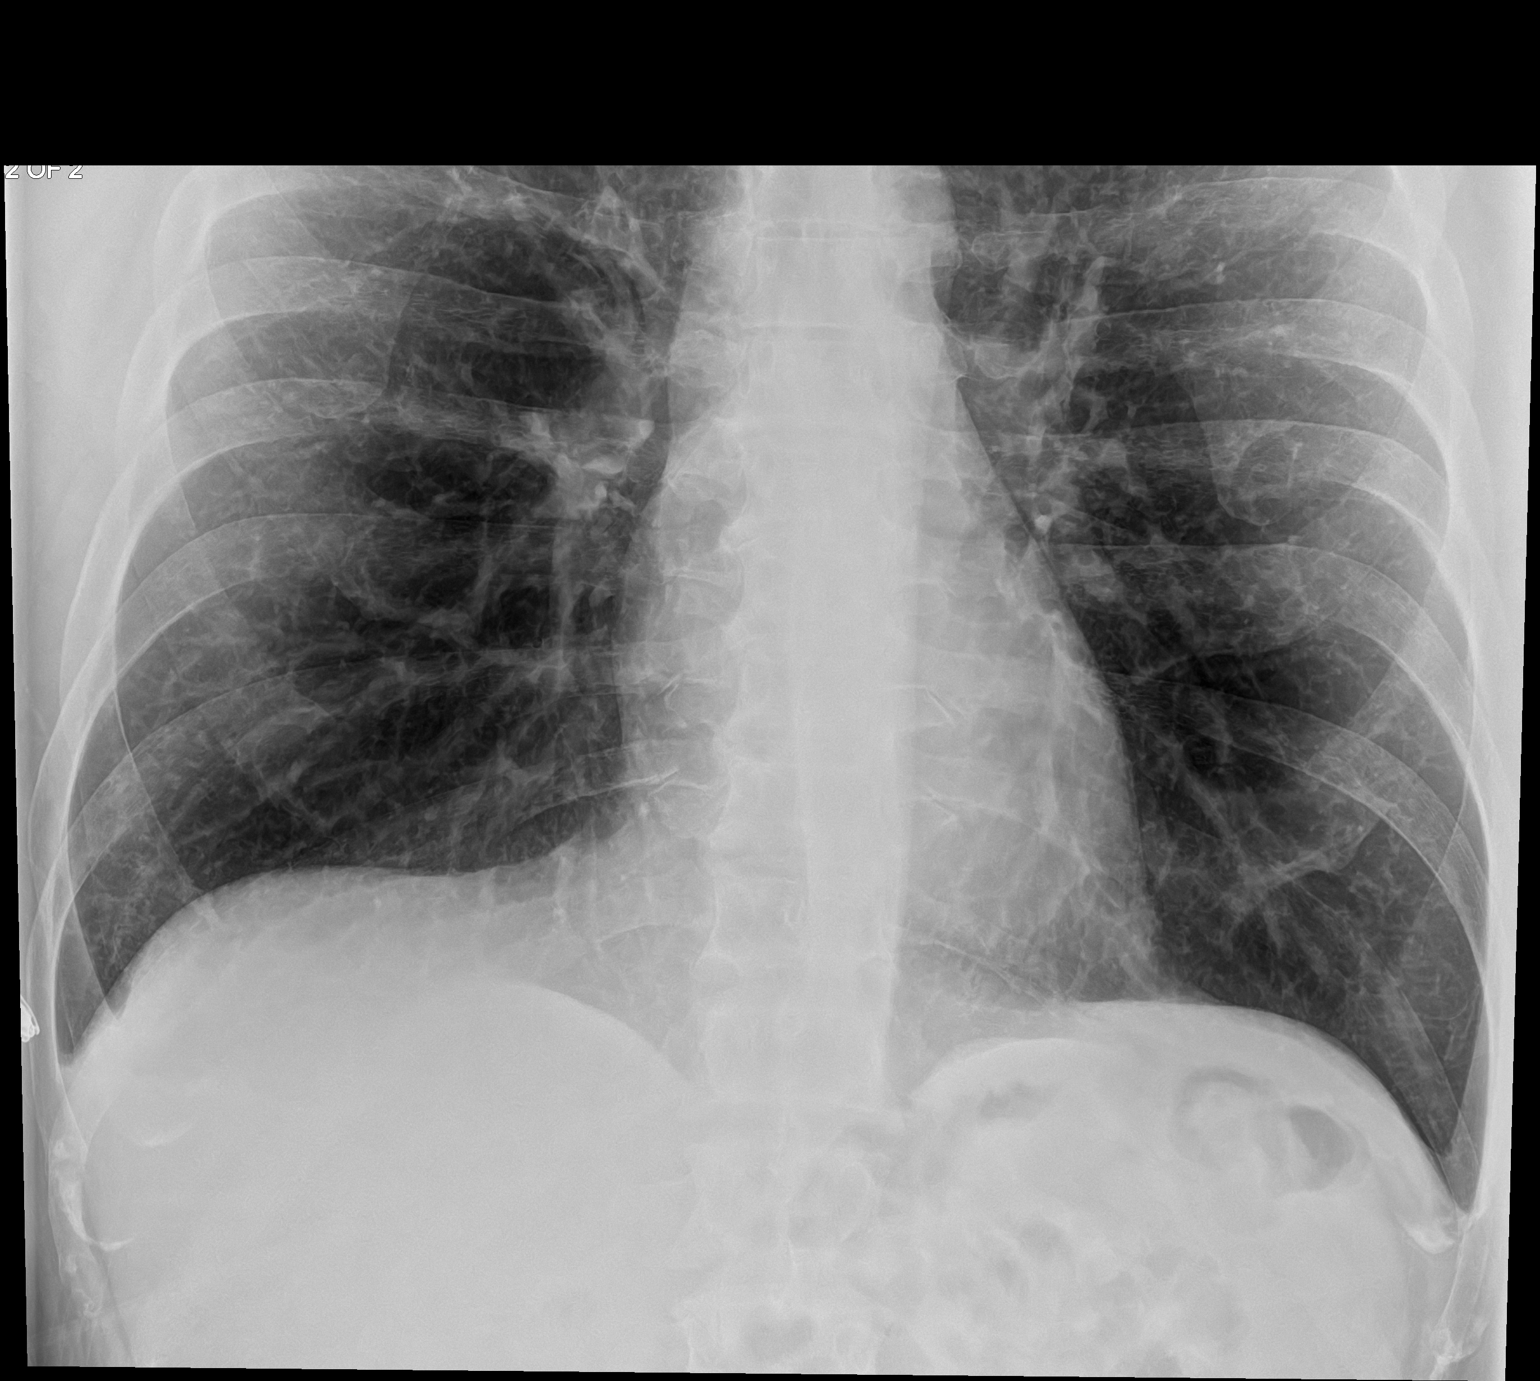

[2 of 2 positions shown; findings below may reference images not displayed]

FINDINGS: Cardiac shadow is within normal limits. The lungs are well aerated
bilaterally. No focal infiltrate or effusion is seen. No bony
abnormality is noted.
IMPRESSION: No active disease.

## 2023-09-28 IMAGING — CT CT ABD-PELV W/ CM
2 of 5 series · 16 of 46 positions shown, 18 images · IV contrast (Omnipaque or Isovue)
Comparison: CT scan 05/06/2020

CLINICAL DATA: Rectal pain and constipation.

EXAM:
CT ABDOMEN AND PELVIS WITH CONTRAST
TECHNIQUE: Multidetector CT imaging of the abdomen and pelvis was performed
using the standard protocol following bolus administration of
intravenous contrast.

[Series 3: axial st · axial · 0.89mm/px · z∈[+809,+1214]mm · 13 of 93 slices shown, 15 images]
[im 6/93  soft-tissue]
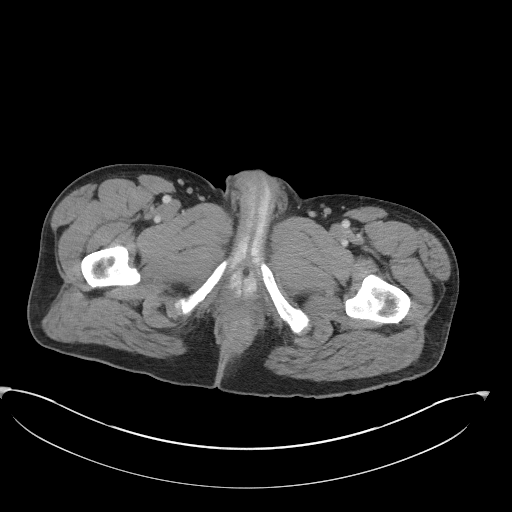
[im 6/93  bone]
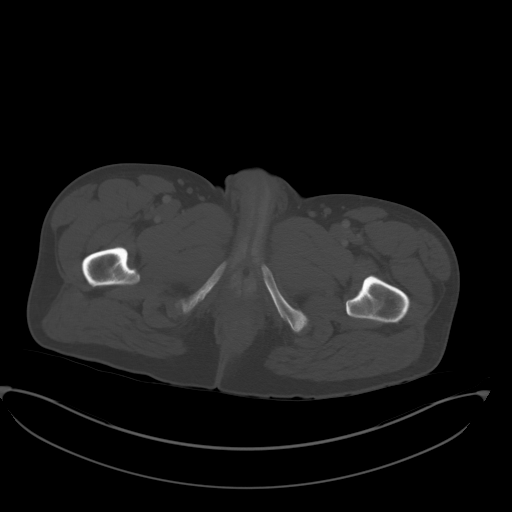
[im 11/93  soft-tissue]
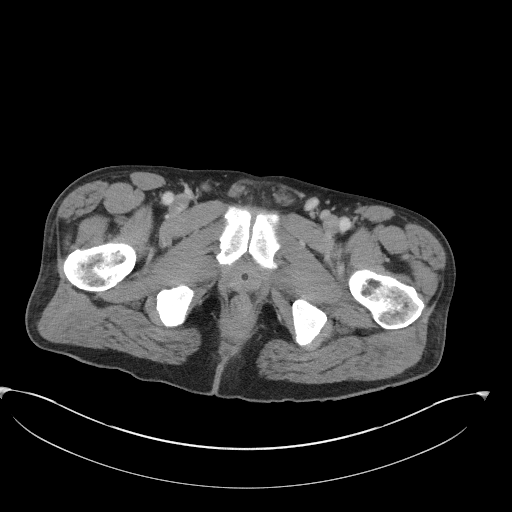
[im 21/93  soft-tissue]
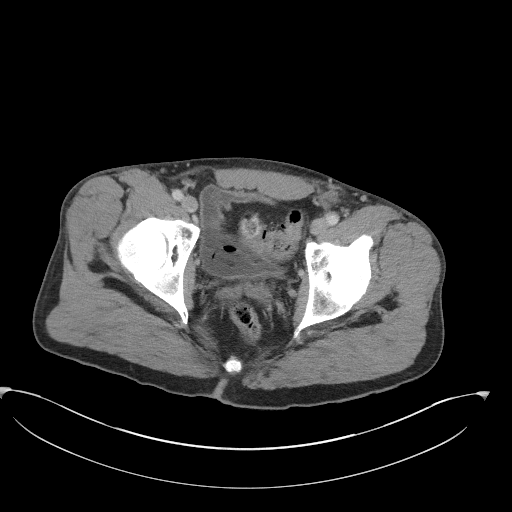
[im 26/93  soft-tissue]
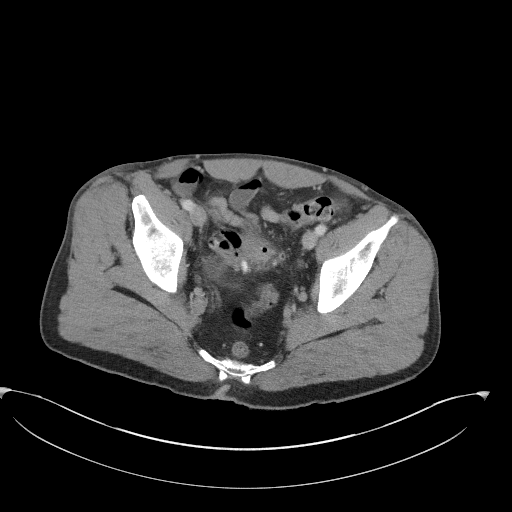
[im 31/93  soft-tissue]
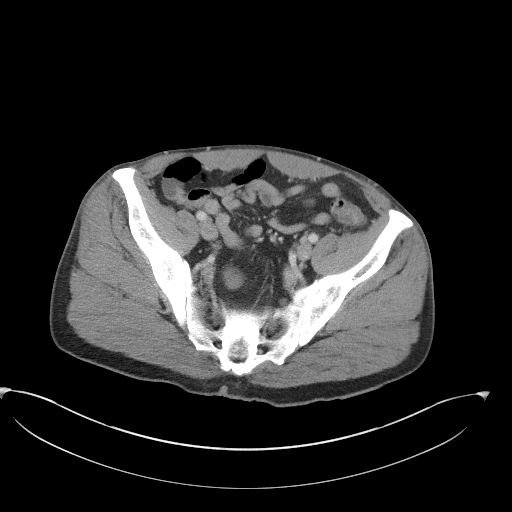
[im 41/93  soft-tissue]
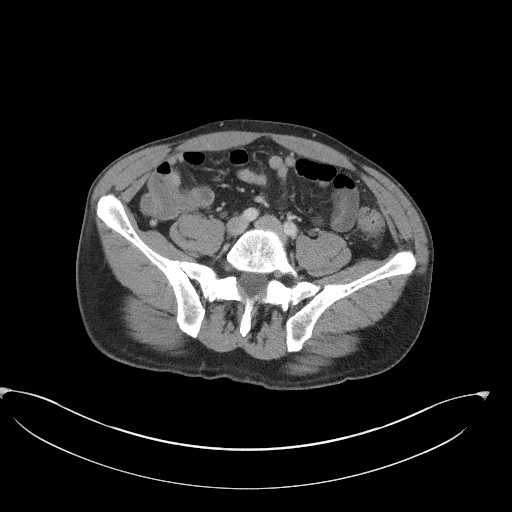
[im 47/93  soft-tissue]
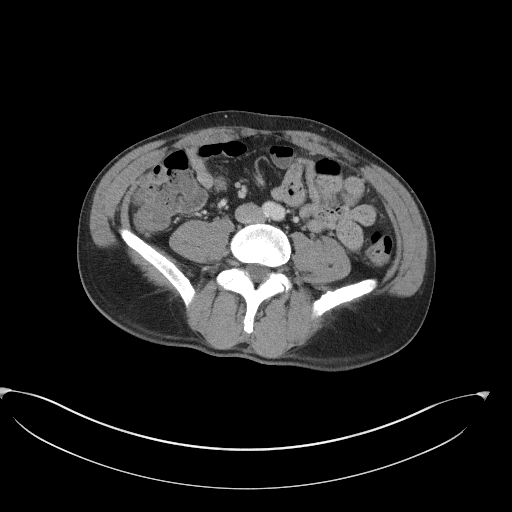
[im 52/93  soft-tissue]
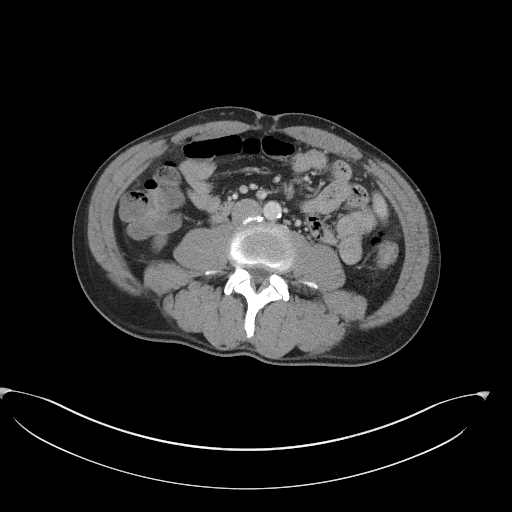
[im 62/93  soft-tissue]
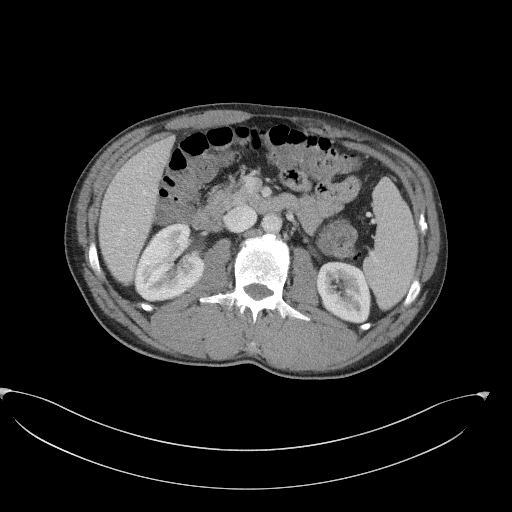
[im 62/93  bone]
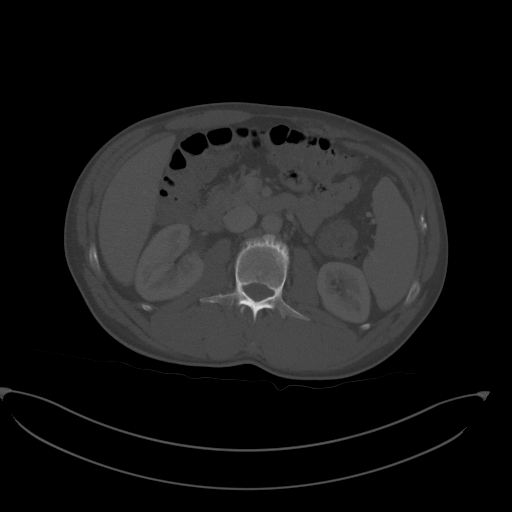
[im 67/93  soft-tissue]
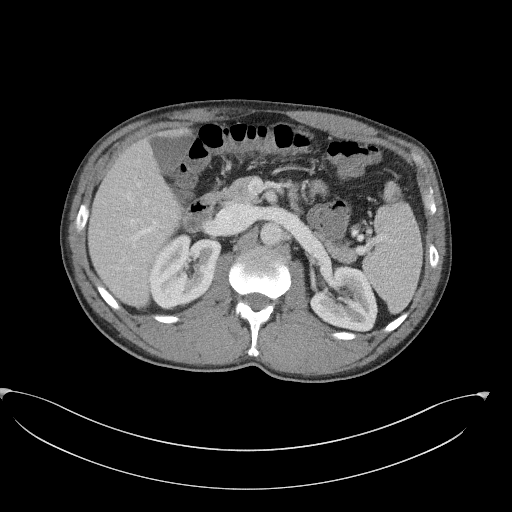
[im 72/93  soft-tissue]
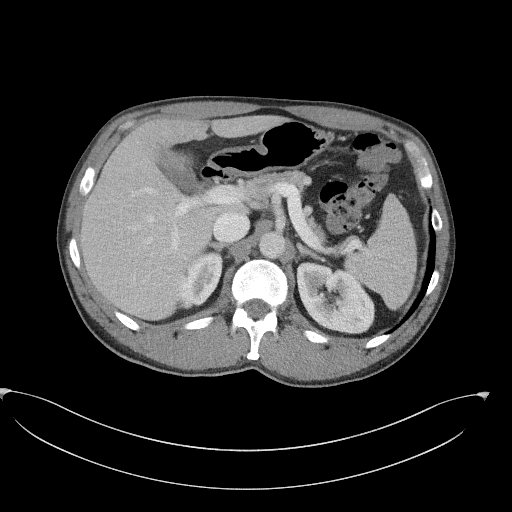
[im 82/93  soft-tissue]
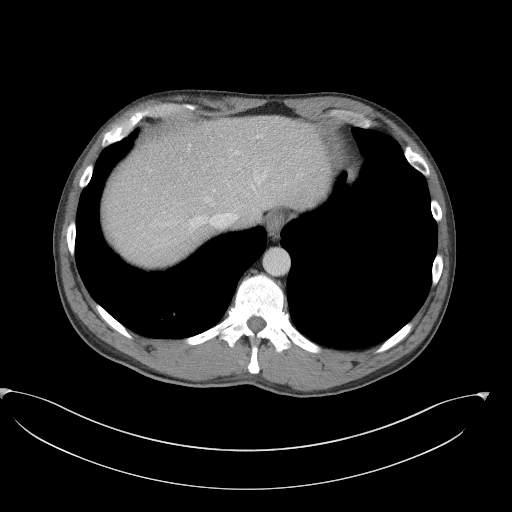
[im 87/93  soft-tissue]
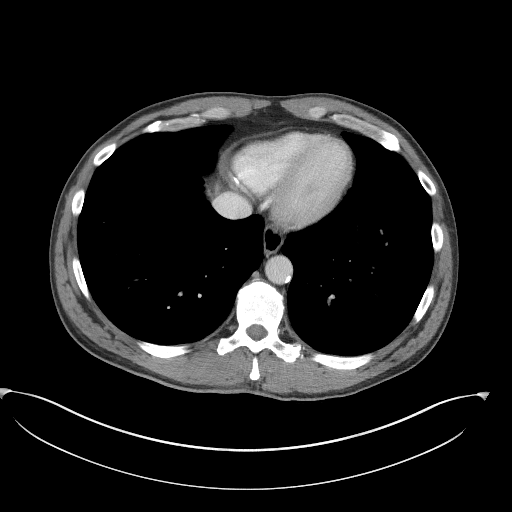

[Series 7: coronal st · coronal · 0.80mm/px · 3 of 117 slices shown]
[im 39/117  soft-tissue]
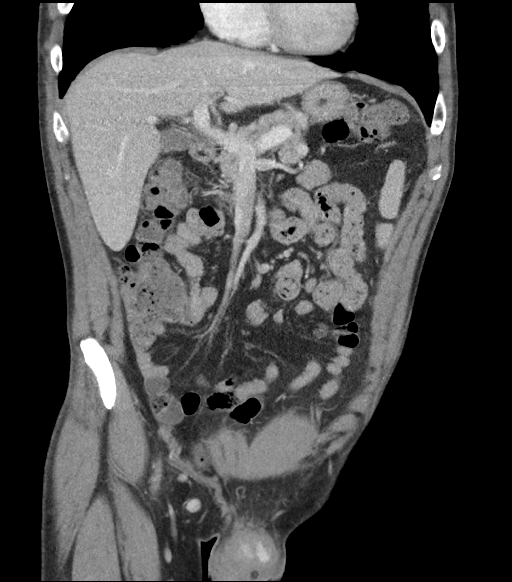
[im 52/117  soft-tissue]
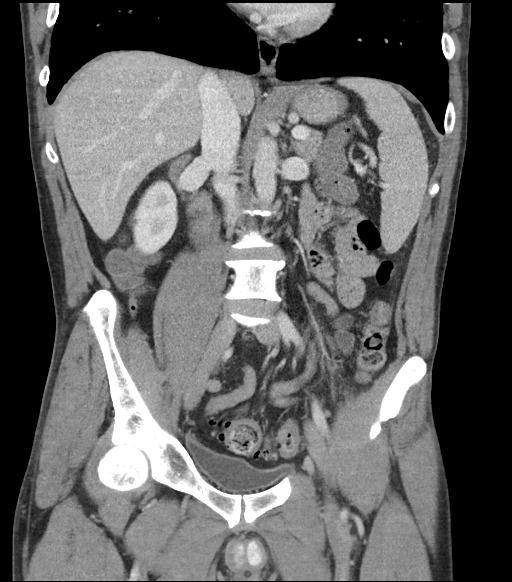
[im 65/117  soft-tissue]
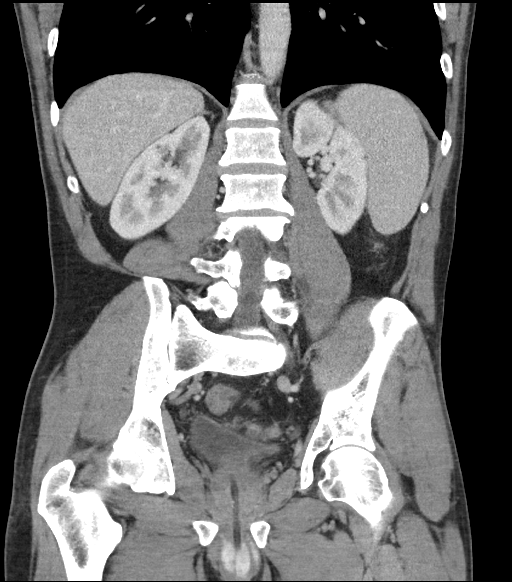

[16 of 46 positions shown; findings below may reference images not displayed]

RADIATION DOSE REDUCTION: This exam was performed according to the
departmental dose-optimization program which includes automated
exposure control, adjustment of the mA and/or kV according to
patient size and/or use of iterative reconstruction technique.

CONTRAST:  100mL OMNIPAQUE IOHEXOL 300 MG/ML  SOLN
FINDINGS: Lower chest: The lung bases are clear of acute process. No pleural
effusion or pulmonary lesions. The heart is normal in size. No
pericardial effusion. The distal esophagus and aorta are
unremarkable.

Hepatobiliary: No hepatic lesions or intrahepatic biliary
dilatation. The gallbladder is unremarkable. No common bile duct
dilatation.

Pancreas: No mass, inflammation or ductal dilatation.

Spleen: Normal size.  No focal lesions.

Adrenals/Urinary Tract: The adrenal glands are normal. No renal
lesions, hydronephrosis or renal calculi. No findings suspicious for
pyelonephritis. The bladder contains a Foley catheter and is
decompressed.

Stomach/Bowel: Stomach, duodenum, small bowel and colon are
unremarkable. No acute inflammatory changes, mass lesions or
obstructive findings. The terminal ileum and appendix are normal.
Moderate sigmoid colon diverticulosis without findings for acute
diverticulitis.

Vascular/Lymphatic: Scattered atherosclerotic calcifications
involving the aorta but no aneurysm or dissection. The branch
vessels are patent. The major venous structures are patent. No
mesenteric or retroperitoneal mass or adenopathy the.

Reproductive: The prostate gland and seminal vesicles are
unremarkable.

Other: There is a perianal abscess likely due to a perianal fistula
at the 5 o'clock position. MRI pelvis without and with contrast
using perianal fistula protocol may be helpful for further
evaluation if clinically necessary or for pre-surgical planning. The
abscess measures approximately 2.6 x 2.1 cm.

Musculoskeletal: No significant bony findings.
IMPRESSION: 1. 2.6 x 2.1 cm perianal abscess likely due to a perianal fistula at
the 5 o'clock position. MRI pelvis without and with contrast using
perianal fistula protocol may be helpful for further evaluation if
clinically necessary or for pre-surgical planning.
2. No acute abdominal/pelvic findings and no mass lesions or
adenopathy.
3. Aortic atherosclerosis.

Aortic Atherosclerosis ([id number].S).

## 2023-11-06 ENCOUNTER — Emergency Department (HOSPITAL_COMMUNITY)
Admission: EM | Admit: 2023-11-06 | Discharge: 2023-11-06 | Disposition: A | Discharge status: Home / Self Care | Attending: Emergency Medicine | Admitting: Emergency Medicine

## 2023-11-06 ENCOUNTER — Other Ambulatory Visit: Payer: Self-pay

## 2023-11-06 ENCOUNTER — Encounter (HOSPITAL_COMMUNITY): Payer: Self-pay | Admitting: *Deleted

## 2023-11-06 ENCOUNTER — Emergency Department (HOSPITAL_COMMUNITY)

## 2023-11-06 DIAGNOSIS — S46812A Strain of other muscles, fascia and tendons at shoulder and upper arm level, left arm, initial encounter: Secondary | ICD-10-CM | POA: Diagnosis not present

## 2023-11-06 DIAGNOSIS — M25512 Pain in left shoulder: Secondary | ICD-10-CM | POA: Diagnosis not present

## 2023-11-06 DIAGNOSIS — X58XXXA Exposure to other specified factors, initial encounter: Secondary | ICD-10-CM | POA: Insufficient documentation

## 2023-11-06 DIAGNOSIS — M546 Pain in thoracic spine: Secondary | ICD-10-CM | POA: Diagnosis not present

## 2023-11-06 DIAGNOSIS — M19012 Primary osteoarthritis, left shoulder: Secondary | ICD-10-CM | POA: Diagnosis not present

## 2023-11-06 DIAGNOSIS — M25712 Osteophyte, left shoulder: Secondary | ICD-10-CM | POA: Diagnosis not present

## 2023-11-06 MED ORDER — NAPROXEN 500 MG PO TABS: 500.0000 mg | ORAL_TABLET | Freq: Two times a day (BID) | ORAL | 0 refills | Status: AC

## 2023-11-06 MED ORDER — OXYCODONE-ACETAMINOPHEN 5-325 MG PO TABS: 1.0000 | ORAL_TABLET | Freq: Four times a day (QID) | ORAL | 0 refills | Status: AC | PRN

## 2023-11-06 NOTE — ED Provider Notes (Signed)
 [place] EM[facility] DEPARTMENT AT [facility] Provider Note   CSN: [id number] Arrival date & time: 11/06/23  1140     History {Add pertinent medical, surgical, social history, OB history to HPI:1} Chief Complaint  Patient presents with   Shoulder Injury    Todd Reilly is a 58 y.o. male.  Patient states he was throwing something and started to have significant pain in his left upper back.   Shoulder Injury       Home Medications Prior to Admission medications   Medication Sig Start Date End Date Taking? Authorizing Provider  naproxen (NAPROSYN) 500 MG tablet Take 1 tablet (500 mg total) by mouth 2 (two) times daily. 11/06/23  Yes Bethann Berkshire, MD  oxyCODONE-acetaminophen (PERCOCET/ROXICET) 5-325 MG tablet Take 1 tablet by mouth every 6 (six) hours as needed for severe pain (pain score 7-10). 11/06/23  Yes Bethann Berkshire, MD  ALPRAZolam Prudy Feeler) 1 MG tablet Take 1 mg by mouth 3 (three) times daily as needed for anxiety. 05/11/20   [provider]  Omega-3 Fatty Acids (FISH OIL) 1000 MG CAPS Take 1 capsule by mouth daily.    [provider]      Allergies    Patient has no known allergies.    Review of Systems   Review of Systems  Physical Exam Updated Vital Signs BP 124/87   Pulse 76   Temp 97.8 F (36.6 C)   Resp 20   Ht 5\' 10"  (1.778 m)   Wt 79.4 kg   SpO2 100%   BMI 25.11 kg/m  Physical Exam  ED Results / Procedures / Treatments   Labs (all labs ordered are listed, but only abnormal results are displayed) Labs Reviewed - No data to display  EKG None  Radiology DG Shoulder Left Result Date: 11/06/2023 CLINICAL DATA:  Pain after injury EXAM: LEFT SHOULDER - 3 VIEW COMPARISON:  None Available. FINDINGS: No fracture or dislocation. Preserved joint spaces and bone mineralization. Minimal osteophytes along the glenohumeral joint. IMPRESSION: Slight degenerative changes along the glenohumeral joint. Electronically Signed   By: Karen Kays M.D.   On: 11/06/2023 14:44    Procedures Procedures  {Document cardiac monitor, telemetry assessment procedure when appropriate:1}  Medications Ordered in ED Medications - No data to display  ED Course/ Medical Decision Making/ A&P   {   Click here for ABCD2, HEART and other calculatorsREFRESH Note before signing :1}                              Medical Decision Making Amount and/or Complexity of Data Reviewed Radiology: ordered.  Risk Prescription drug management.   Patient with left trapezius muscle strain.  He will be placed on Naprosyn and Percocet and follow-up with Ortho if needed {Document critical care time when appropriate:1} {Document review of labs and clinical decision tools ie heart score, Chads2Vasc2 etc:1}  {Document your independent review of radiology images, and any outside records:1} {Document your discussion with family members, caretakers, and with consultants:1} {Document social determinants of health affecting pt's care:1} {Document your decision making why or why not admission, treatments were needed:1} Final Clinical Impression(s) / ED Diagnoses Final diagnoses:  Strain of left trapezius muscle, initial encounter    Rx / DC Orders ED Discharge Orders          Ordered    naproxen (NAPROSYN) 500 MG tablet  2 times daily  11/06/23 1506    oxyCODONE-acetaminophen (PERCOCET/ROXICET) 5-325 MG tablet  Every 6 hours PRN        11/06/23 1506

## 2023-11-06 NOTE — Discharge Instructions (Addendum)
 Follow-up with Dr. Romeo Apple if not improving in 1 to 2 weeks

## 2023-11-06 NOTE — ED Triage Notes (Signed)
 Pt in c/o L shoulder pain onset x 4 days when he was grabbing a steel tank and his shoulder twisted, pt reports ongoing pain with relief of pain when he holds his L arm over his head, reports he cannot lay on it, denies numbness & tingling in his L arm, A&O x4

## 2023-11-06 NOTE — ED Notes (Signed)
 Pt verbalizes to this RN his frustration about having to wait so long to get in a room. Pt presents in NAD. Pt ambulatory to treatment room.

## 2023-11-13 DIAGNOSIS — F419 Anxiety disorder, unspecified: Secondary | ICD-10-CM | POA: Diagnosis not present

## 2023-11-13 DIAGNOSIS — E663 Overweight: Secondary | ICD-10-CM | POA: Diagnosis not present

## 2023-11-13 DIAGNOSIS — Z6825 Body mass index (BMI) 25.0-25.9, adult: Secondary | ICD-10-CM | POA: Diagnosis not present

## 2023-11-13 DIAGNOSIS — M25512 Pain in left shoulder: Secondary | ICD-10-CM | POA: Diagnosis not present

## 2024-01-22 DIAGNOSIS — Z6825 Body mass index (BMI) 25.0-25.9, adult: Secondary | ICD-10-CM | POA: Diagnosis not present

## 2024-01-22 DIAGNOSIS — E663 Overweight: Secondary | ICD-10-CM | POA: Diagnosis not present

## 2024-01-22 DIAGNOSIS — F419 Anxiety disorder, unspecified: Secondary | ICD-10-CM | POA: Diagnosis not present

## 2024-03-10 DIAGNOSIS — Z6825 Body mass index (BMI) 25.0-25.9, adult: Secondary | ICD-10-CM | POA: Diagnosis not present

## 2024-03-10 DIAGNOSIS — F419 Anxiety disorder, unspecified: Secondary | ICD-10-CM | POA: Diagnosis not present

## 2024-03-10 DIAGNOSIS — E663 Overweight: Secondary | ICD-10-CM | POA: Diagnosis not present

## 2024-04-09 DIAGNOSIS — M24541 Contracture, right hand: Secondary | ICD-10-CM | POA: Diagnosis not present

## 2024-05-12 DIAGNOSIS — D696 Thrombocytopenia, unspecified: Secondary | ICD-10-CM | POA: Diagnosis not present

## 2024-05-12 DIAGNOSIS — E785 Hyperlipidemia, unspecified: Secondary | ICD-10-CM | POA: Diagnosis not present

## 2024-05-12 DIAGNOSIS — Z125 Encounter for screening for malignant neoplasm of prostate: Secondary | ICD-10-CM | POA: Diagnosis not present

## 2024-05-12 DIAGNOSIS — F411 Generalized anxiety disorder: Secondary | ICD-10-CM | POA: Diagnosis not present

## 2024-05-12 DIAGNOSIS — Z0000 Encounter for general adult medical examination without abnormal findings: Secondary | ICD-10-CM | POA: Diagnosis not present

## 2024-07-10 DIAGNOSIS — M24541 Contracture, right hand: Secondary | ICD-10-CM | POA: Diagnosis not present

## 2024-07-10 DIAGNOSIS — M729 Fibroblastic disorder, unspecified: Secondary | ICD-10-CM | POA: Diagnosis not present

## 2024-07-10 DIAGNOSIS — M25541 Pain in joints of right hand: Secondary | ICD-10-CM | POA: Diagnosis not present

## 2024-07-15 DIAGNOSIS — F411 Generalized anxiety disorder: Secondary | ICD-10-CM | POA: Diagnosis not present

## 2024-07-15 DIAGNOSIS — M6530 Trigger finger, unspecified finger: Secondary | ICD-10-CM | POA: Diagnosis not present
